# Patient Record
Sex: Female | Born: 2000 | Race: Black or African American | Hispanic: No | Marital: Single | Smoking: Never smoker
Health system: Southern US, Community
[De-identification: ages and names within clinical notes are randomized; demographics above are authoritative.]

## PROBLEM LIST (undated history)

## (undated) ENCOUNTER — Ambulatory Visit

## (undated) DIAGNOSIS — A749 Chlamydial infection, unspecified: Secondary | ICD-10-CM

## (undated) DIAGNOSIS — D649 Anemia, unspecified: Secondary | ICD-10-CM

## (undated) HISTORY — PX: FRACTURE SURGERY: SHX138

---
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
## 2020-08-26 ENCOUNTER — Other Ambulatory Visit: Payer: Self-pay

## 2020-08-26 ENCOUNTER — Encounter: Payer: Self-pay | Admitting: Nurse Practitioner

## 2020-08-26 ENCOUNTER — Ambulatory Visit (INDEPENDENT_AMBULATORY_CARE_PROVIDER_SITE_OTHER): Payer: 59 | Admitting: Nurse Practitioner

## 2020-08-26 ENCOUNTER — Other Ambulatory Visit (INDEPENDENT_AMBULATORY_CARE_PROVIDER_SITE_OTHER): Payer: 59

## 2020-08-26 VITALS — BP 108/68 | HR 98 | Wt 133.0 lb

## 2020-08-26 DIAGNOSIS — D509 Iron deficiency anemia, unspecified: Secondary | ICD-10-CM

## 2020-08-26 DIAGNOSIS — K625 Hemorrhage of anus and rectum: Secondary | ICD-10-CM | POA: Diagnosis not present

## 2020-08-26 LAB — CBC WITH DIFFERENTIAL/PLATELET
Basophils Absolute: 0.1 10*3/uL (ref 0.0–0.1)
Basophils Relative: 0.9 % (ref 0.0–3.0)
Eosinophils Absolute: 0.1 10*3/uL (ref 0.0–0.7)
Eosinophils Relative: 1.1 % (ref 0.0–5.0)
HCT: 40.8 % (ref 36.0–49.0)
Hemoglobin: 13.7 g/dL (ref 12.0–16.0)
Lymphocytes Relative: 38.4 % (ref 24.0–48.0)
Lymphs Abs: 2.6 10*3/uL (ref 0.7–4.0)
MCHC: 33.6 g/dL (ref 31.0–37.0)
MCV: 82.3 fl (ref 78.0–98.0)
Monocytes Absolute: 0.4 10*3/uL (ref 0.1–1.0)
Monocytes Relative: 5.7 % (ref 3.0–12.0)
Neutro Abs: 3.7 10*3/uL (ref 1.4–7.7)
Neutrophils Relative %: 53.9 % (ref 43.0–71.0)
Platelets: 257 10*3/uL (ref 150.0–575.0)
RBC: 4.96 Mil/uL (ref 3.80–5.70)
RDW: 13.8 % (ref 11.4–15.5)
WBC: 6.8 10*3/uL (ref 4.5–13.5)

## 2020-08-26 LAB — FERRITIN: Ferritin: 14.9 ng/mL (ref 10.0–291.0)

## 2020-08-26 MED ORDER — HYDROCORTISONE (PERIANAL) 2.5 % EX CREA: 1.0000 "application " | TOPICAL_CREAM | Freq: Every day | CUTANEOUS | 1 refills | Status: DC

## 2020-08-26 NOTE — Progress Notes (Signed)
     ASSESSMENT AND PLAN    # 20 yo female several month history of intermittent rectal bleeding with bowel movements. Small internal hemorrhoids on anoscopy. --CBC   --Continue with good hydration.  --Start daily Metamucil --Anusol cream PR Q HS x 10 days --Follow up with me in 3-4 weeks. If persistent bleeding then consider colonoscopy   # Hx of microcytic anemia in December 2020 (around time of C-section).  --update labs with CBC, ferritin, tibc  HISTORY OF PRESENT ILLNESS     Chief Complaint : hemorrhoids  Shelia Rivera is a 20 y.o. female with no significant past medical history. She is referred by PCP for rectal bleeding and hemorrhoids.   Several months ago patient developed hemorrhoids with perianal discomfort and intermittent bleeding with bowel movements.  She has used prep H but only externally but it has sometimes helped. Her stools are sometimes hard even though she takes 2 stool softeners at night. She sometimes feels like straining is necessary to expel stool past the hemorrhoids.  She generally has about 4 BMs a week. She drinks a gallon of water a day and eats a high fiber diet. She has no abdominal pain, nausea / vomiting or other GI complaints.   No FMH of GI diseases in family except maybe a paternal uncle with colon cancer at unknown.age.    Data Reviewed: December 2020 Hgb mid 8 range ( following a C-section) .   PREVIOUS EVALUATIONS:   none  Past Medical History:  Diagnosis Date  . Anemia   . Chlamydia      Past Surgical History:  Procedure Laterality Date  . CESAREAN SECTION N/A 05/16/2019   Procedure: CESAREAN SECTION;  Surgeon: Kathrynn Running, MD;  Location: [facility] LD ORS;  Service: Obstetrics;  Laterality: N/A;   No family history on file. Social History   Tobacco Use  . Smoking status: Never Smoker  . Smokeless tobacco: Never Used  Vaping Use  . Vaping Use: Never used  Substance Use Topics  . Alcohol use: Never  . Drug use: Never    No current outpatient medications on file.   No current facility-administered medications for this visit.   No Known Allergies   Review of Systems: All  systems reviewed and negative except where noted in HPI.   PHYSICAL EXAM :    Wt Readings from Last 3 Encounters:  08/26/20 133 lb (60.3 kg) (59 %, Z= 0.22)*  05/31/19 126 lb 8 oz (57.4 kg) (52 %, Z= 0.06)*  05/16/19 142 lb (64.4 kg) (76 %, Z= 0.70)*   * Growth percentiles are based on CDC (Girls, 2-20 Years) data.    BP 108/68   Pulse 98   Wt 133 lb (60.3 kg)   BMI 26.86 kg/m  Constitutional:  Pleasant female in no acute distress. Psychiatric: Normal mood and affect. Behavior is normal. EENT: Pupils normal.  Conjunctivae are normal. No scleral icterus. Neck supple.  Cardiovascular: Normal rate, regular rhythm. No edema Pulmonary/chest: Effort normal and breath sounds normal. No wheezing, rales or rhonchi. Abdominal: Soft, nondistended, nontender. Bowel sounds active throughout. There are no masses palpable. No hepatomegaly. Rectal: no fissures or external hemorrhoids. On anoscopy she has mildly inflamed internal hemorrhoids.  Neurological: Alert and oriented to person place and time. Skin: Skin is warm and dry. No rashes noted.  Willette Cluster, NP  08/26/2020, 3:22 PM  Cc:  Referring Provider Dinsbeer, Leotis Shames, FNP

## 2020-08-26 NOTE — Progress Notes (Signed)
Agree with assessment and plan as outlined. If she still has persistent bleeding despite conservative measures would pursue colonoscopy.

## 2020-08-26 NOTE — Patient Instructions (Addendum)
Continue to take 2  stool softeners at night   Add Metamucil every morning   We will send Anusol cream to your pharmacy  Your provider has requested that you go to the basement level for lab work before leaving today. Press "B" on the elevator. The lab is located at the first door on the left as you exit the elevator.  Due to recent changes in healthcare laws, you may see the results of your imaging and laboratory studies on MyChart before your provider has had a chance to review them.  We understand that in some cases there may be results that are confusing or concerning to you. Not all laboratory results come back in the same time frame and the provider may be waiting for multiple results in order to interpret others.  Please give [place] 48 hours in order for your provider to thoroughly review all the results before contacting the office for clarification of your results.   I appreciate the  opportunity to care for you  Thank You   Midge Minium

## 2020-08-27 ENCOUNTER — Other Ambulatory Visit: Payer: Self-pay

## 2020-08-27 ENCOUNTER — Telehealth: Payer: Self-pay

## 2020-08-27 DIAGNOSIS — K625 Hemorrhage of anus and rectum: Secondary | ICD-10-CM

## 2020-08-27 LAB — IRON AND TIBC
Iron Saturation: 13 % — ABNORMAL LOW (ref 15–55)
Iron: 42 ug/dL (ref 27–159)
Total Iron Binding Capacity: 324 ug/dL (ref 250–450)
UIBC: 282 ug/dL (ref 131–425)

## 2020-08-27 MED ORDER — FERROUS SULFATE 325 (65 FE) MG PO TABS: 325.0000 mg | ORAL_TABLET | Freq: Every day | ORAL | 3 refills | Status: DC

## 2020-08-27 NOTE — Telephone Encounter (Signed)
Left message for patient to please call back. 

## 2020-08-27 NOTE — Telephone Encounter (Signed)
-----   Message from Meredith Pel, NP sent at 08/27/2020  1:23 PM EDT ----- She appears to be iron deficient without anemia.  I will be seeing her back in clinic for rectal bleeding and can discuss further.  Iron deficiency in a menstruating female is not unusual but given the rectal bleeding a colonoscopy is not unreasonable.  Without any upper GI symptoms I do not think she needs an upper endoscopy.  Celiac disease unusual in a a population but can check a TTG and IgA when she comes for follow-up. Recommend ferrous sulfate 325 mg daily and repeat ferritin and TIBC in 8 weeks.

## 2020-08-29 IMAGING — CT CT ABD-PELV W/ CM
2 of 4 series · 16 of 46 positions shown, 18 images · IV contrast (APPLIED)
Comparison: Pelvic ultrasound 05/20/2019

CLINICAL DATA: Postoperative fever.  Five days post C-section.

EXAM:
CT ABDOMEN AND PELVIS WITH CONTRAST
TECHNIQUE: Multidetector CT imaging of the abdomen and pelvis was performed
using the standard protocol following bolus administration of
intravenous contrast.
CONTRAST:  100mL OMNIPAQUE IOHEXOL 300 MG/ML  SOLN

[Series 3: abdomen 5.0 · axial · 0.73mm/px · z∈[+650,+1040]mm · 13 of 88 slices shown, 15 images]
[im 5/88  soft-tissue]
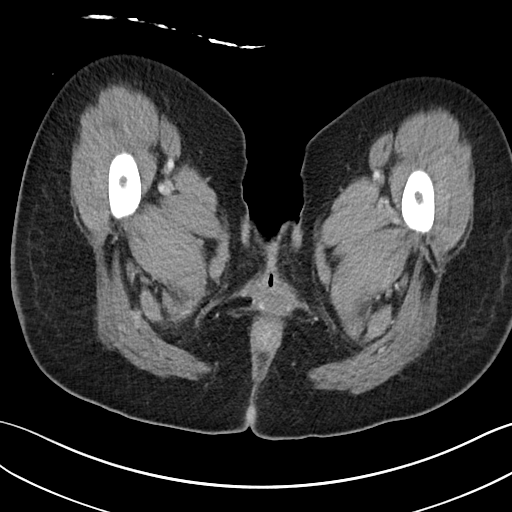
[im 5/88  bone]
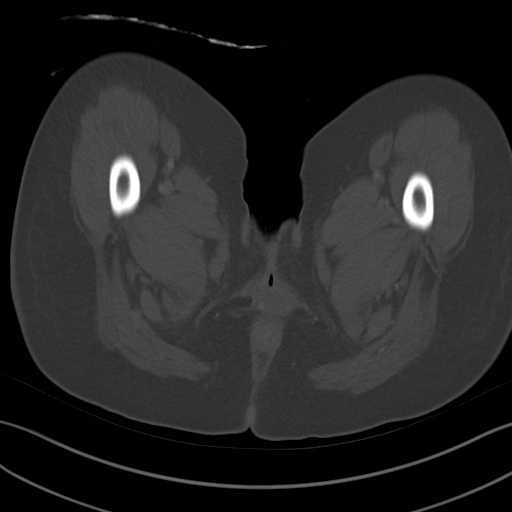
[im 14/88  soft-tissue]
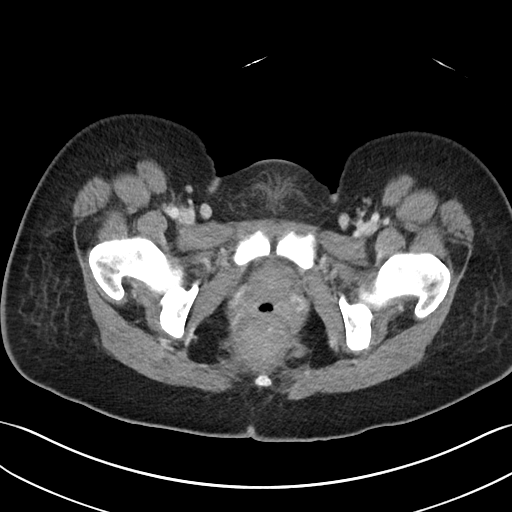
[im 19/88  soft-tissue]
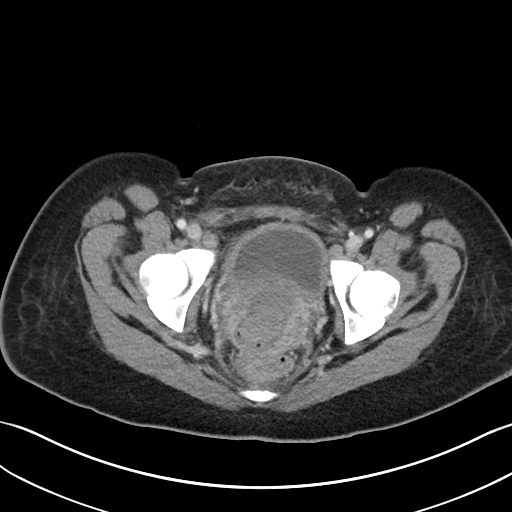
[im 23/88  soft-tissue]
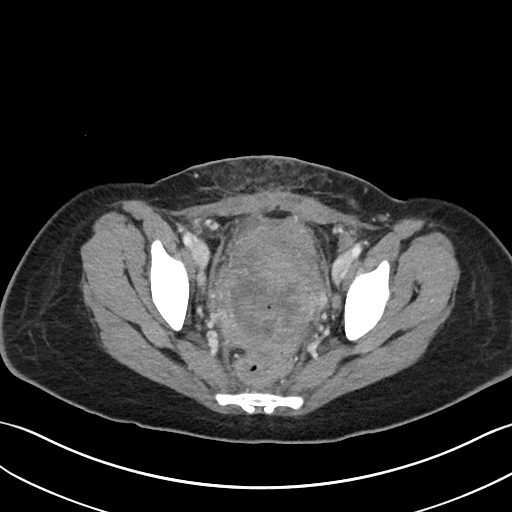
[im 33/88  soft-tissue]
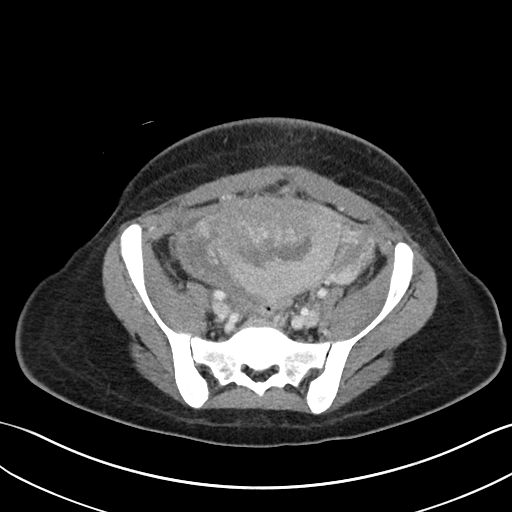
[im 37/88  soft-tissue]
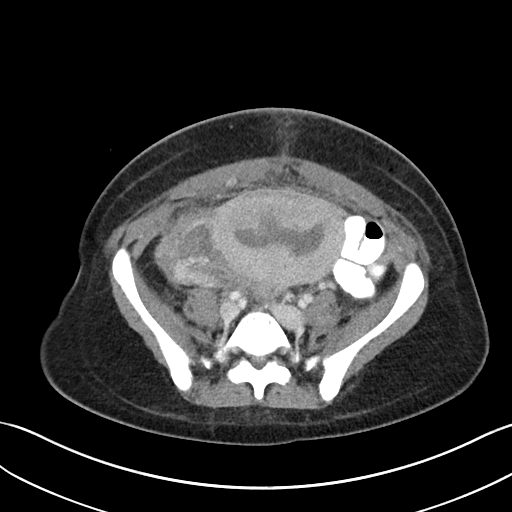
[im 46/88  soft-tissue]
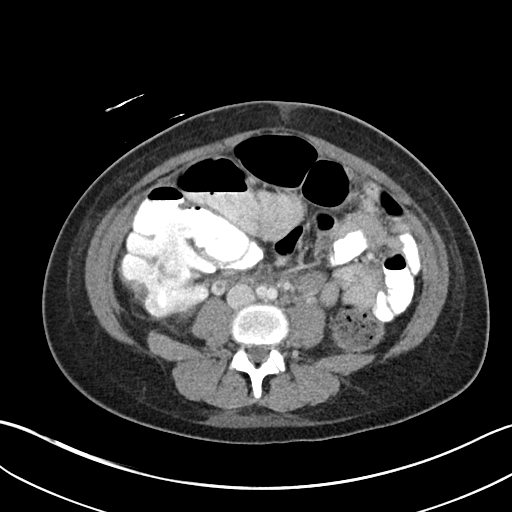
[im 51/88  soft-tissue]
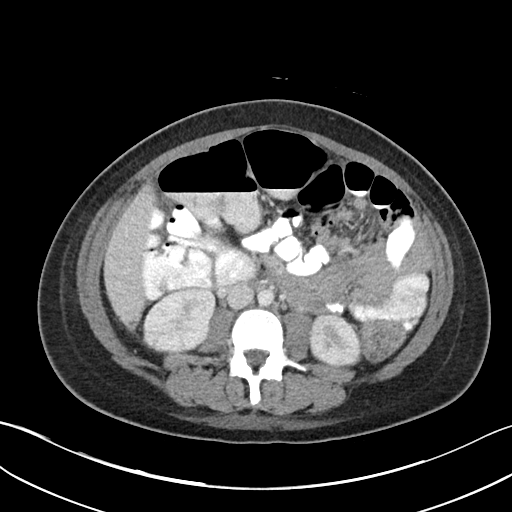
[im 55/88  soft-tissue]
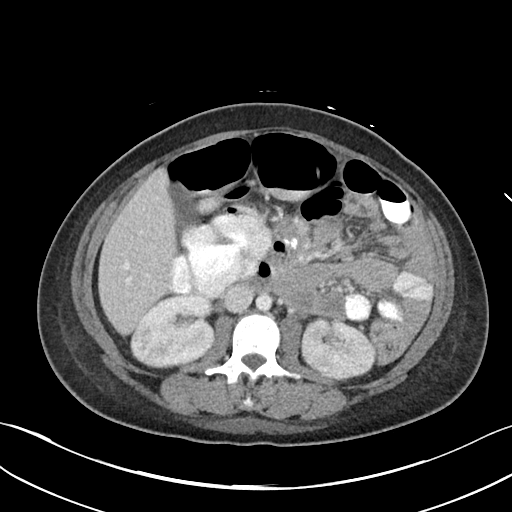
[im 55/88  bone]
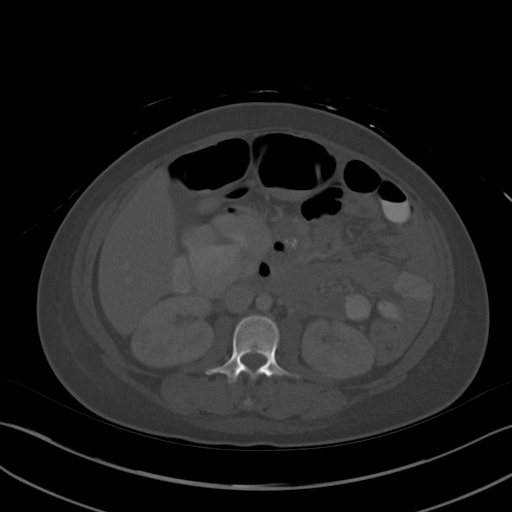
[im 65/88  soft-tissue]
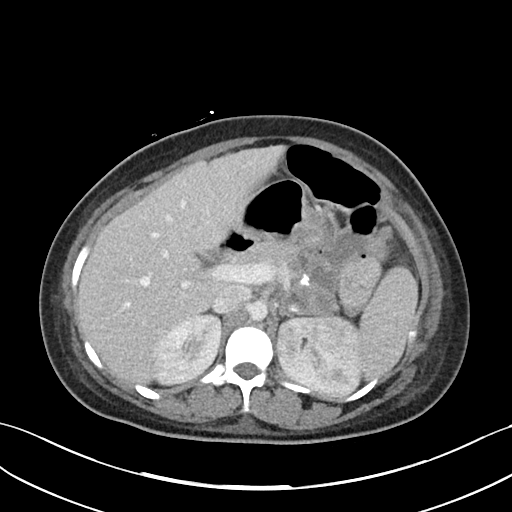
[im 69/88  soft-tissue]
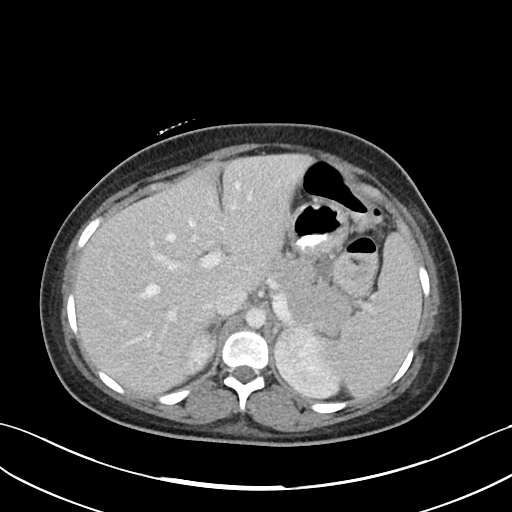
[im 74/88  soft-tissue]
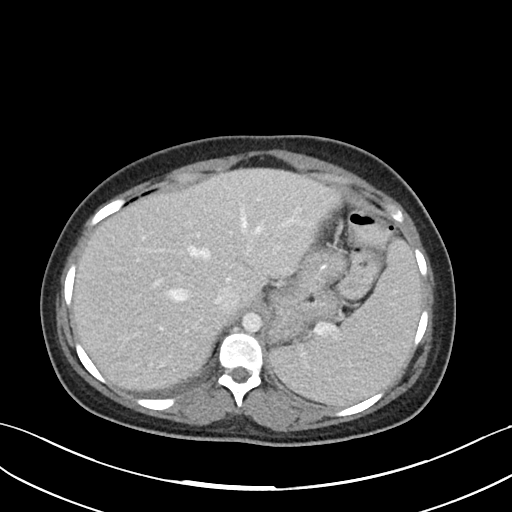
[im 83/88  soft-tissue]
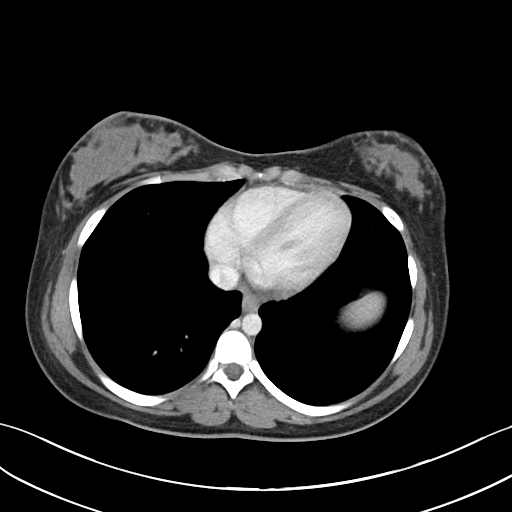

[Series 6: abdomen 3.0 mpr cor · coronal · 0.71mm/px · 3 of 81 slices shown]
[im 27/81  soft-tissue]
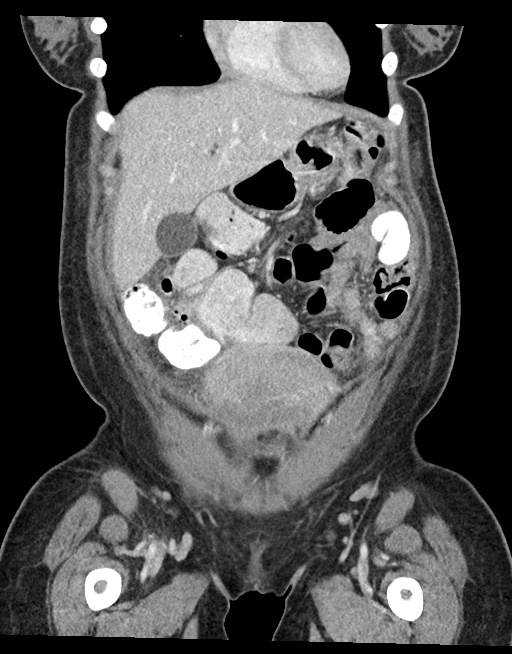
[im 36/81  soft-tissue]
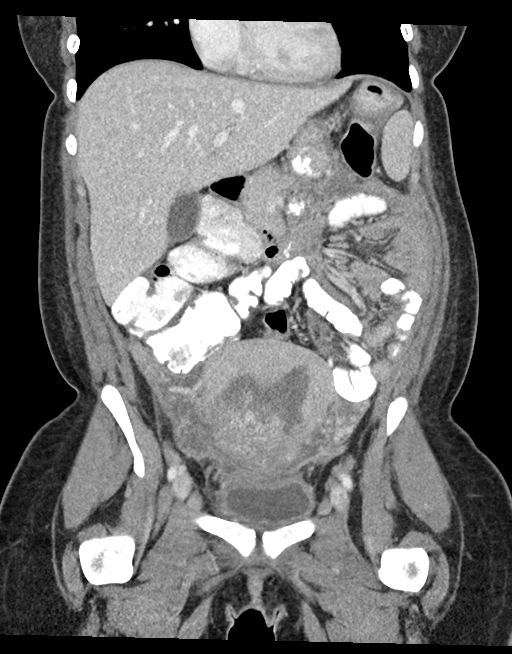
[im 45/81  soft-tissue]
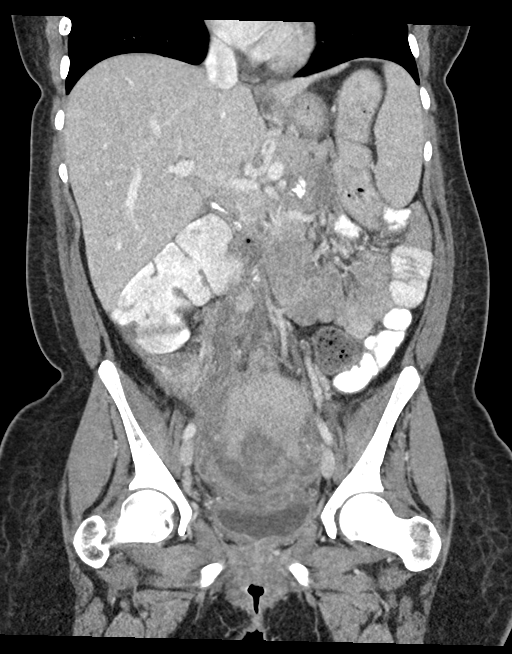

[16 of 46 positions shown; findings below may reference images not displayed]

FINDINGS: Lower chest: The lung bases are clear of acute process. No pleural
effusion or pulmonary lesions. The heart is normal in size. No
pericardial effusion. The distal esophagus and aorta are
unremarkable.

Hepatobiliary: No focal hepatic lesions or intrahepatic biliary
dilatation. The gallbladder is normal. No common bile duct
dilatation.

Pancreas: No mass, inflammation or ductal dilatation.

Spleen: Borderline splenomegaly measuring 12.5 x 11.5 x 10.5 cm. No
focal lesions.

Adrenals/Urinary Tract: Adrenal glands are normal.

Kidneys demonstrate changes of pyelonephritis but no renal abscess
or hydronephrosis. Bladder is grossly normal.

Stomach/Bowel: The stomach, duodenum, small bowel and colon are
grossly normal. No inflammatory changes, mass lesions or obstructive
findings. The terminal ileum and appendix are normal.

Vascular/Lymphatic: The aorta is normal in caliber. The branch
vessels are patent. No mesenteric or retroperitoneal mass or
adenopathy.

Reproductive: Expected appearing postpartum changes. No obvious
retraining products. Prominent vascularity in the anterior
myometrium likely due to the placental attachment. Expected debris
in the endocervical canal. I do not see any findings suspicious for
endometrial abscess.

There is a filling defect in the right gonadal vein but no complete
occlusion/thrombosis.

Other: No free pelvic fluid collections, intrapelvic abscess or
intra-abdominal abscess.

Musculoskeletal: No significant bony findings.
IMPRESSION: 1. CT findings consistent with bilateral pyelonephritis. No abscess
or hydronephrosis.
2. Small amount of thrombus is noted in the right gonadal vein.
3. Expected postpartum changes involving the uterus. No obvious
findings for endometrial abscess or pelvic abscess or retained
products of conception.

## 2020-09-05 IMAGING — US US RENAL
1 series · 15 of 25 positions shown · non-contrast
Comparison: None.

CLINICAL DATA: Initial evaluation for acute lower back an
right-sided abdominal pain. Twelve days postpartum.

EXAM:
RENAL / URINARY TRACT ULTRASOUND COMPLETE

[Series 1: us renal · 15 of 39 slices shown]
[im 1/39]
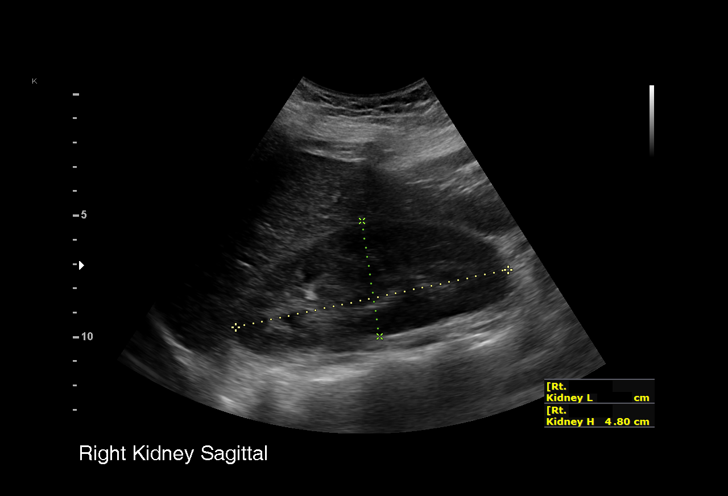
[im 4/39]
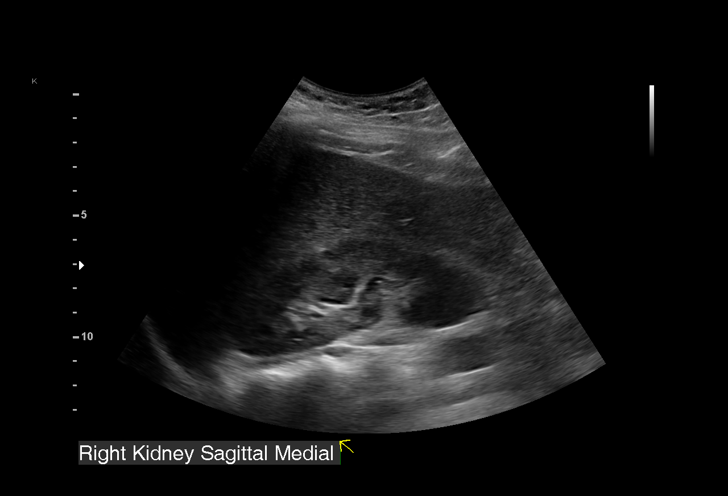
[im 7/39]
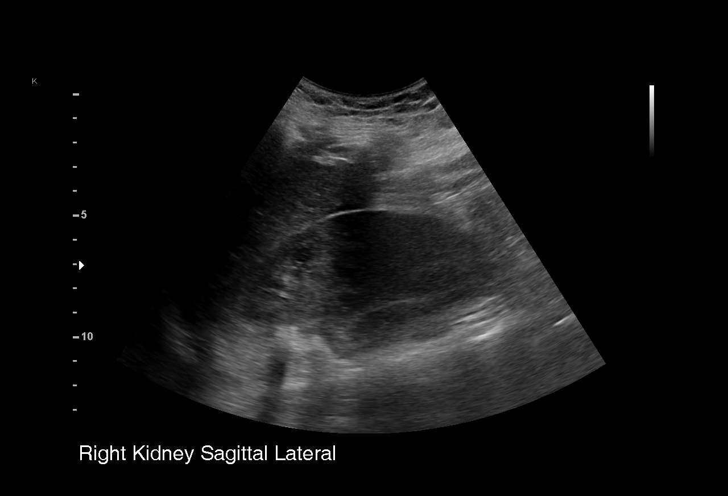
[im 8/39]
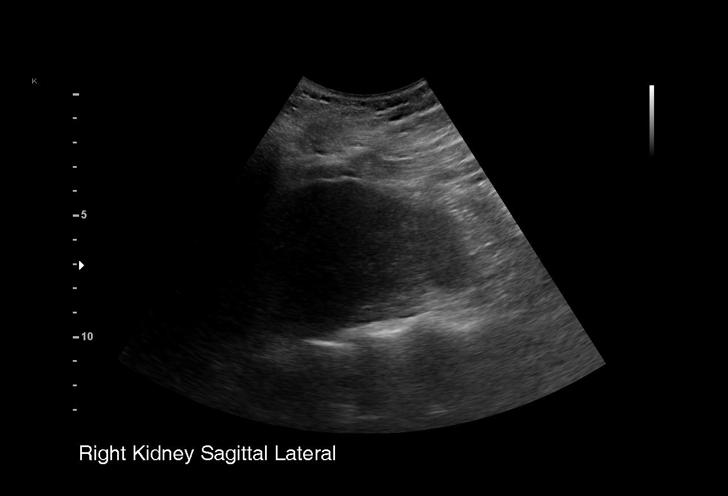
[im 12/39]
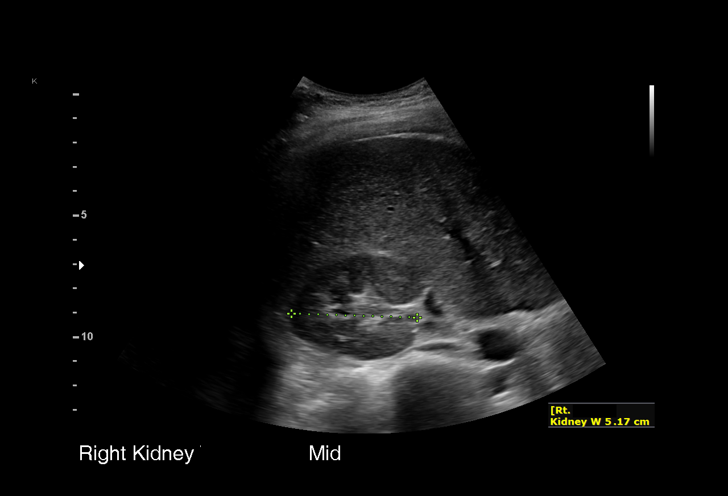
[im 15/39]
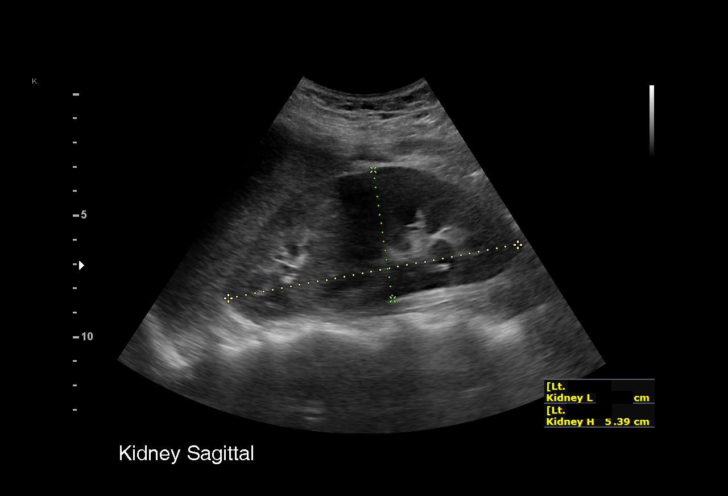
[im 16/39]
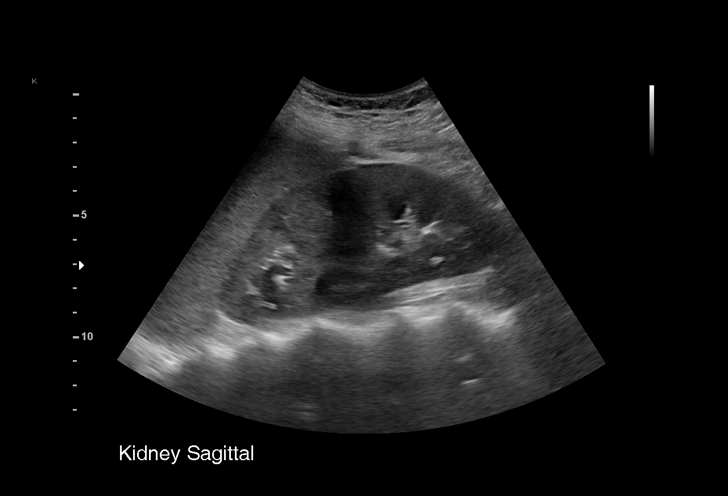
[im 20/39]
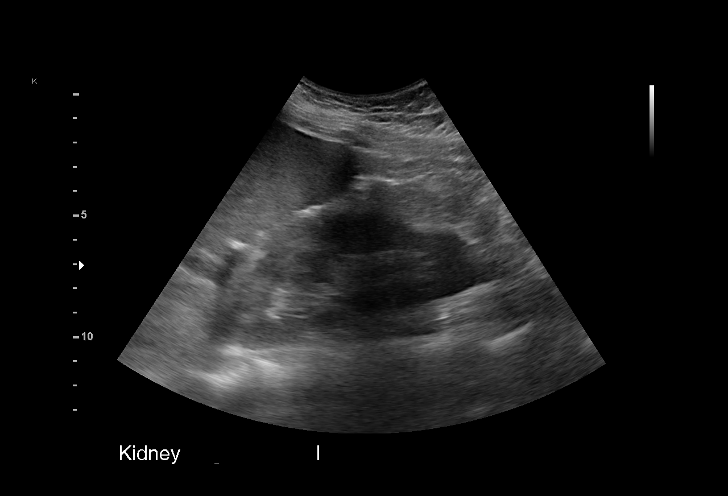
[im 23/39]
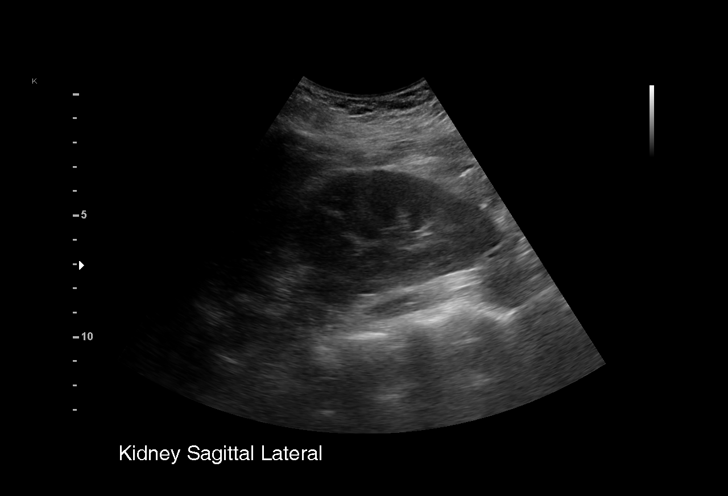
[im 24/39]
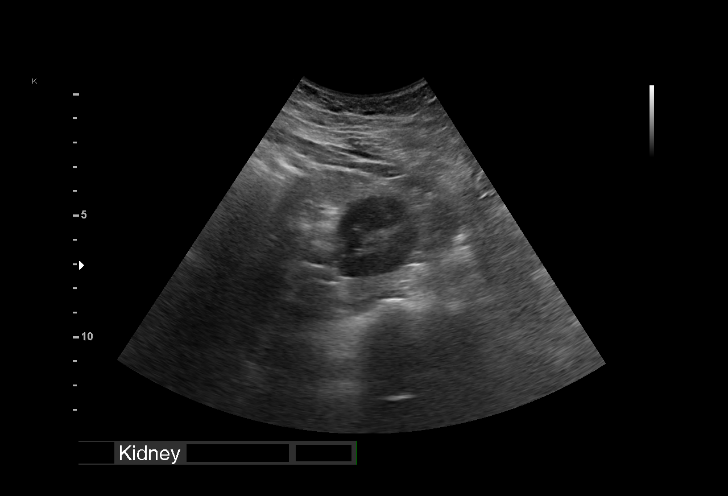
[im 27/39]
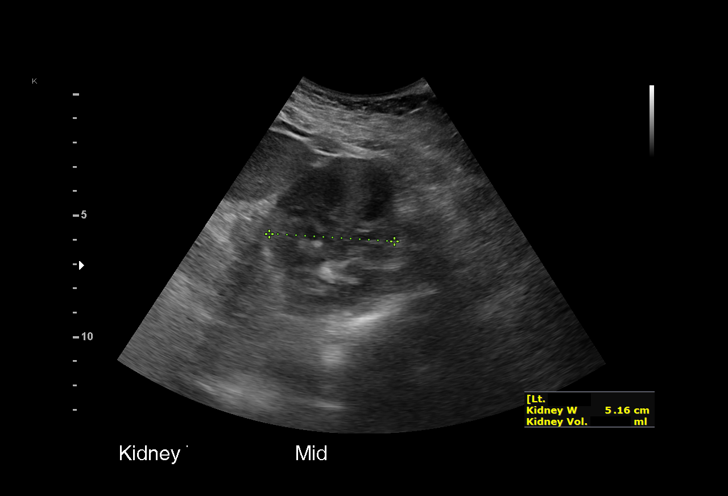
[im 31/39]
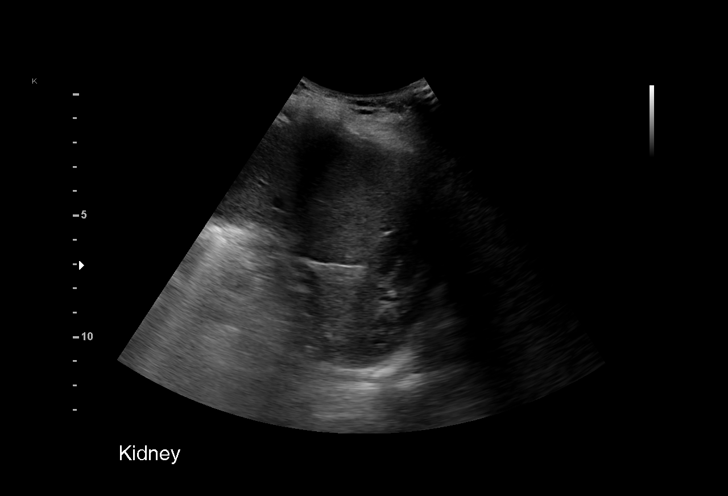
[im 32/39]
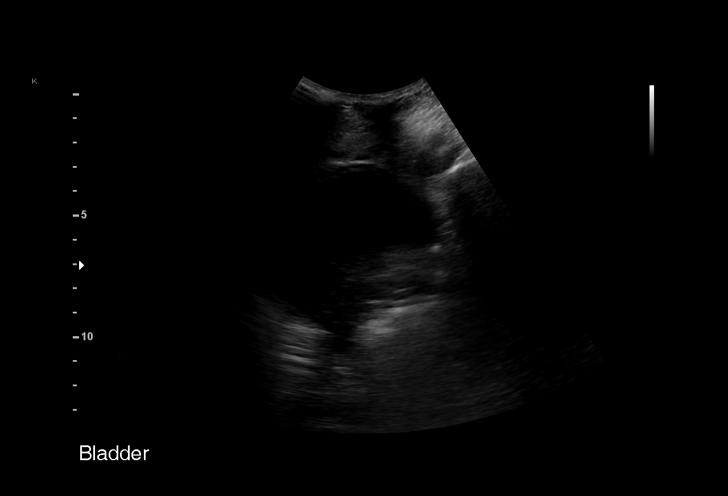
[im 35/39]
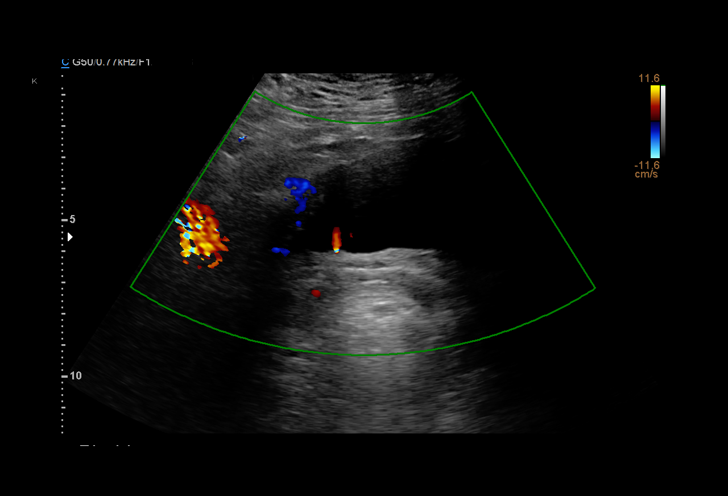
[im 39/39]
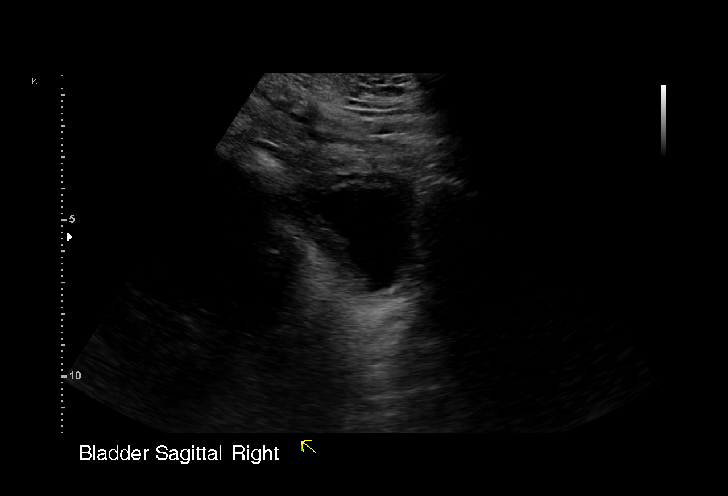

[15 of 25 positions shown; findings below may reference images not displayed]

FINDINGS: Right Kidney:

Renal measurements: 11.5 x 4.8 x 5.2 cm = volume: 149.5 mL.
Echogenicity within normal limits. No mass or hydronephrosis
visualized. No visible nephrolithiasis.

Left Kidney:

Renal measurements: 12.1 x 5.4 x 5.2 cm = volume: 176.3 mL.
Echogenicity within normal limits. No mass or hydronephrosis
visualized. No visible nephrolithiasis.

Bladder:

Appears normal for degree of bladder distention. Bilateral ureteral
jets are visualized.

Other:

None.
IMPRESSION: Normal renal ultrasound. No sonographic evidence for nephrolithiasis
or obstructive uropathy.

## 2020-10-02 ENCOUNTER — Ambulatory Visit: Payer: 59 | Admitting: Nurse Practitioner

## 2020-10-13 ENCOUNTER — Other Ambulatory Visit: Payer: Self-pay

## 2020-10-13 ENCOUNTER — Encounter: Payer: Self-pay | Admitting: Nurse Practitioner

## 2020-10-13 ENCOUNTER — Ambulatory Visit (INDEPENDENT_AMBULATORY_CARE_PROVIDER_SITE_OTHER): Payer: 59 | Admitting: Nurse Practitioner

## 2020-10-13 VITALS — BP 112/68 | HR 80 | Ht 59.0 in | Wt 136.0 lb

## 2020-10-13 DIAGNOSIS — K625 Hemorrhage of anus and rectum: Secondary | ICD-10-CM | POA: Diagnosis not present

## 2020-10-13 MED ORDER — NA SULFATE-K SULFATE-MG SULF 17.5-3.13-1.6 GM/177ML PO SOLN: 1.0000 | Freq: Once | ORAL | 0 refills | Status: AC

## 2020-10-13 NOTE — Progress Notes (Signed)
Agree with assessment and plan as outlined.  

## 2020-10-13 NOTE — Progress Notes (Signed)
Reviewed and agree with management plans. ? ?Shelia Nathanson L. Lara Palinkas, MD, MPH  ?

## 2020-10-13 NOTE — Patient Instructions (Addendum)
If you are age 20 or older, your body mass index should be between 23-30. Your Body mass index is 27.47 kg/m. If this is out of the aforementioned range listed, please consider follow up with your Primary Care Provider.  If you are age 58 or younger, your body mass index should be between 19-25. Your Body mass index is 27.47 kg/m. If this is out of the aformentioned range listed, please consider follow up with your Primary Care Provider.   Miralax 1 capful daily.  You have been scheduled for a colonoscopy. Please follow written instructions given to you at your visit today.  Please pick up your prep supplies at the pharmacy within the next 1-3 days. If you use inhalers (even only as needed), please bring them with you on the day of your procedure.  Due to recent changes in healthcare laws, you may see the results of your imaging and laboratory studies on MyChart before your provider has had a chance to review them.  We understand that in some cases there may be results that are confusing or concerning to you. Not all laboratory results come back in the same time frame and the provider may be waiting for multiple results in order to interpret others.  Please give [place] 48 hours in order for your provider to thoroughly review all the results before contacting the office for clarification of your results.

## 2020-10-13 NOTE — Progress Notes (Signed)
     ASSESSMENT AND PLAN    #20 year old female with intermittent, painless rectal bleeding with bowel movements.  Bleeding resolved after course of Anusol and addition of Metamucil but then she had 2 more episodes of bleeding a few weeks. The bleeding is most likely secondary to internal hemorrhoids found on anoscopy last visit but will proceed with colonoscopy to rule out other etiologies.  --The risks and benefits of colonoscopy with possible polypectomy / biopsies were discussed and the patient agrees to proceed.  Procedure will be done by Dr. Orvan Falconer as Dr. Adela Lank has no availability until late July -- Continue Metamucil, take on a daily basis if tolerated    HISTORY OF PRESENT ILLNESS    Chief Complaint : Follow-up on rectal bleeding  Shelia Rivera is a 20 y.o. female known to Dr. Adela Lank. She has no significant medical history.   Patient was seen late March 2022 for evaluation of a several month history of intermittent painless rectal bleeding with bowel movements.  She had small internal hemorrhoids on anoscopy.  Patient was started on daily Metamucil and treated with 10 days of Anusol cream.  Labs were checked and though her hemoglobin was 13.7, her ferritin was only 14.9.  TIBC was normal at 324 but her percent saturation was low at 13    INTERVAL HISTORY: Shelia Rivera used the Ansuol for 10 days. She has been taking Metamucil about three times a week and averaging about 5 soft BMs a week. The rectal bleeding stopped after a course of Anusol but then she had two additional episodes of minor bleeding a few weeks ago.       Past Medical History:  Diagnosis Date  . Anemia   . Chlamydia     Current Medications, Allergies, Past Surgical History, Family History and Social History were reviewed in [employer] record.   Current Outpatient Medications  Medication Sig Dispense Refill  . Docusate Calcium (STOOL SOFTENER PO) Take by mouth.    .  hydrocortisone (ANUSOL-HC) 2.5 % rectal cream Place 1 application rectally at bedtime. For 10 days 30 g 1  . psyllium (METAMUCIL) 58.6 % powder Take 1 packet by mouth daily.     No current facility-administered medications for this visit.    Review of Systems: No chest pain. No shortness of breath. No urinary complaints.   PHYSICAL EXAM :    Wt Readings from Last 3 Encounters:  10/13/20 136 lb (61.7 kg) (63 %, Z= 0.34)*  08/26/20 133 lb (60.3 kg) (59 %, Z= 0.22)*  05/31/19 126 lb 8 oz (57.4 kg) (52 %, Z= 0.06)*   * Growth percentiles are based on CDC (Girls, 2-20 Years) data.    BP 112/68   Pulse 80   Ht 4\' 11"  (1.499 m)   Wt 136 lb (61.7 kg)   BMI 27.47 kg/m  Constitutional:  Pleasant female in no acute distress. Psychiatric: Normal mood and affect. Behavior is normal. EENT: Pupils normal.  Conjunctivae are normal. No scleral icterus. Neck supple.  Cardiovascular: Normal rate, regular rhythm. No edema Pulmonary/chest: Effort normal and breath sounds normal. No wheezing, rales or rhonchi. Abdominal: Soft, nondistended, nontender. Bowel sounds active throughout. There are no masses palpable. No hepatomegaly. Neurological: Alert and oriented to person place and time. Skin: Skin is warm and dry. No rashes noted.  , NP  10/13/2020, 3:24 PM

## 2020-10-24 ENCOUNTER — Encounter: Payer: Self-pay | Admitting: Gastroenterology

## 2020-10-30 ENCOUNTER — Other Ambulatory Visit: Payer: Self-pay

## 2021-02-26 ENCOUNTER — Ambulatory Visit (HOSPITAL_COMMUNITY)
Admission: EM | Admit: 2021-02-26 | Discharge: 2021-02-26 | Disposition: A | Discharge status: Home / Self Care | Payer: 59 | Attending: Physician Assistant | Admitting: Physician Assistant

## 2021-02-26 ENCOUNTER — Other Ambulatory Visit: Payer: Self-pay

## 2021-02-26 ENCOUNTER — Ambulatory Visit (INDEPENDENT_AMBULATORY_CARE_PROVIDER_SITE_OTHER): Payer: 59

## 2021-02-26 ENCOUNTER — Encounter (HOSPITAL_COMMUNITY): Payer: Self-pay | Admitting: Emergency Medicine

## 2021-02-26 DIAGNOSIS — J40 Bronchitis, not specified as acute or chronic: Secondary | ICD-10-CM

## 2021-02-26 DIAGNOSIS — R058 Other specified cough: Secondary | ICD-10-CM

## 2021-02-26 DIAGNOSIS — J329 Chronic sinusitis, unspecified: Secondary | ICD-10-CM

## 2021-02-26 DIAGNOSIS — R059 Cough, unspecified: Secondary | ICD-10-CM

## 2021-02-26 DIAGNOSIS — R0602 Shortness of breath: Secondary | ICD-10-CM | POA: Diagnosis not present

## 2021-02-26 MED ORDER — AMOXICILLIN-POT CLAVULANATE 875-125 MG PO TABS: 1.0000 | ORAL_TABLET | Freq: Two times a day (BID) | ORAL | 0 refills | Status: DC

## 2021-02-26 MED ORDER — METHYLPREDNISOLONE SODIUM SUCC 125 MG IJ SOLR
INTRAMUSCULAR | Status: AC
  Filled 2021-02-26: qty 2

## 2021-02-26 MED ORDER — METHYLPREDNISOLONE SODIUM SUCC 125 MG IJ SOLR
60.0000 mg | Freq: Once | INTRAMUSCULAR | Status: AC
  Administered 2021-02-26: 60 mg via INTRAMUSCULAR

## 2021-02-26 NOTE — ED Provider Notes (Addendum)
[facility]    CSN: [id number] Arrival date & time: 02/26/21  8338      History   Chief Complaint Chief Complaint  Patient presents with   Cough   Chest Pain   Shoulder Pain    HPI Shelia Rivera is a 20 y.o. female.   Patient presents today with a weeklong history of cough and congestion symptoms.  She reports over the past 24 hours these have worsened and her mucus has become thicker and she is now experiencing shortness of breath.  She also reports intermittent central chest stabbing pains.  She is currently asymptomatic and has not identified any trigger.  She denies any pleuritic pain.  She does not take birth control and denies any history of malignancy, or personal or family history of VTE event.  She denies any recent antibiotic use.  She has tried Mucinex without improvement of symptoms.  She took a COVID test that was negative.  She is up-to-date on COVID-19 vaccinations.  Denies history of asthma, allergies, COPD, smoking.   Past Medical History:  Diagnosis Date   Anemia    Chlamydia     Patient Active Problem List   Diagnosis Date Noted   Labor and delivery, indication for care 05/16/2019   S/P primary low transverse C-section 05/16/2019   Sepsis (HCC) 05/16/2019    Past Surgical History:  Procedure Laterality Date   CESAREAN SECTION N/A 05/16/2019   Procedure: CESAREAN SECTION;  Surgeon: Kathrynn Running, MD;  Location: [facility] LD ORS;  Service: Obstetrics;  Laterality: N/A;   FRACTURE SURGERY      OB History     Gravida  1   Para  1   Term  1   Preterm      AB      Living  1      SAB      IAB      Ectopic      Multiple  0   Live Births  1            Home Medications    Prior to Admission medications   Medication Sig Start Date End Date Taking? Authorizing Provider  amoxicillin-clavulanate (AUGMENTIN) 875-125 MG tablet Take 1 tablet by mouth every 12 (twelve) hours. 02/26/21  Yes Alka Falwell, Noberto Retort, PA-C    Family  History History reviewed. No pertinent family history.  Social History Social History   Tobacco Use   Smoking status: Never   Smokeless tobacco: Never  Vaping Use   Vaping Use: Never used  Substance Use Topics   Alcohol use: Never   Drug use: Never     Allergies   Patient has no known allergies.   Review of Systems Review of Systems  Constitutional:  Positive for activity change and fatigue. Negative for appetite change and fever.  HENT:  Positive for congestion and sinus pressure. Negative for sneezing and sore throat.   Respiratory:  Positive for cough, chest tightness and shortness of breath. Negative for wheezing.   Cardiovascular:  Negative for chest pain.  Gastrointestinal:  Negative for abdominal pain, diarrhea, nausea and vomiting.  Musculoskeletal:  Negative for arthralgias and myalgias.  Neurological:  Positive for headaches. Negative for dizziness and light-headedness.    Physical Exam Triage Vital Signs ED Triage Vitals  Enc Vitals Group     BP 02/26/21 0958 124/77     Pulse Rate 02/26/21 0958 (!) 114     Resp 02/26/21 0958 17  Temp 02/26/21 0958 99 F (37.2 C)     Temp Source 02/26/21 0958 Oral     SpO2 02/26/21 0958 100 %     Weight --      Height --      Head Circumference --      Peak Flow --      Pain Score 02/26/21 0955 0     Pain Loc --      Pain Edu? --      Excl. in GC? --    No data found.  Updated Vital Signs BP 124/77 (BP Location: Right Arm)   Pulse (!) 103   Temp 99 F (37.2 C) (Oral)   Resp 17   SpO2 100%   Visual Acuity Right Eye Distance:   Left Eye Distance:   Bilateral Distance:    Right Eye Near:   Left Eye Near:    Bilateral Near:     Physical Exam Vitals reviewed.  Constitutional:      General: She is awake. She is not in acute distress.    Appearance: Normal appearance. She is well-developed. She is not ill-appearing.     Comments: Very pleasant female appears stated age no acute distress sitting  comfortably in exam room  HENT:     Head: Normocephalic and atraumatic.     Right Ear: Tympanic membrane, ear canal and external ear normal. Tympanic membrane is not erythematous or bulging.     Left Ear: Tympanic membrane, ear canal and external ear normal. Tympanic membrane is not erythematous or bulging.     Nose:     Right Sinus: No maxillary sinus tenderness or frontal sinus tenderness.     Left Sinus: No maxillary sinus tenderness or frontal sinus tenderness.     Mouth/Throat:     Pharynx: Uvula midline. Posterior oropharyngeal erythema present. No oropharyngeal exudate.     Comments: Erythema and drainage present posterior pharynx Cardiovascular:     Rate and Rhythm: Normal rate and regular rhythm.     Heart sounds: Normal heart sounds, S1 normal and S2 normal. No murmur heard. Pulmonary:     Effort: Pulmonary effort is normal.     Breath sounds: Normal breath sounds. No wheezing, rhonchi or rales.  Psychiatric:        Behavior: Behavior is cooperative.     UC Treatments / Results  Labs (all labs ordered are listed, but only abnormal results are displayed) Labs Reviewed - No data to display  EKG   Radiology DG Chest 2 View  Result Date: 02/26/2021 CLINICAL DATA:  Shortness of breath, productive cough EXAM: CHEST - 2 VIEW COMPARISON:  05/20/2019 FINDINGS: The heart size and mediastinal contours are within normal limits. Both lungs are clear. The visualized skeletal structures are unremarkable. IMPRESSION: No acute abnormality of the lungs. Electronically Signed   By: Jearld Lesch M.D.   On: 02/26/2021 13:19    Procedures Procedures (including critical care time)  Medications Ordered in UC Medications  methylPREDNISolone sodium succinate (SOLU-MEDROL) 125 mg/2 mL injection 60 mg (60 mg Intramuscular Given 02/26/21 1307)    Initial Impression / Assessment and Plan / UC Course  I have reviewed the triage vital signs and the nursing notes.  Pertinent labs & imaging  results that were available during my care of the patient were reviewed by me and considered in my medical decision making (see chart for details).     EKG obtained showed normal sinus rhythm with ventricular rate of 96 bpm without  ischemic changes; no previous to compare.  Chest x-ray obtained showed no acute abnormalities.  No indication for viral testing given patient has been symptomatic for a week.  She was given 60 mg of Solu-Medrol in clinic today without significant improvement.  We will start antibiotics given prolonged and worsening symptoms.  Encouraged her to use over-the-counter medications including Tylenol, Mucinex, Flonase for symptom relief.  Discussed alarm symptoms that would warrant emergent evaluation.  Strict return precautions given to which she expressed understanding.  Final Clinical Impressions(s) / UC Diagnoses   Final diagnoses:  Sinobronchitis  Productive cough     Discharge Instructions      Take antibiotics as we discussed.  Continue over-the-counter medications including Tylenol, Mucinex, Flonase for additional symptom relief.  If your symptoms are not improving please return for reevaluation.     ED Prescriptions     Medication Sig Dispense Auth. Provider   amoxicillin-clavulanate (AUGMENTIN) 875-125 MG tablet Take 1 tablet by mouth every 12 (twelve) hours. 14 tablet Stone Spirito, Noberto Retort, PA-C      PDMP not reviewed this encounter.   Jeani Hawking, PA-C 02/26/21 1332    Ercel Pepitone, Noberto Retort, PA-C 02/26/21 1349

## 2021-02-26 NOTE — ED Triage Notes (Signed)
Pt c/o cough and congest that has been on going since last weekend. Reports that mucous went from clear to a greenish. Reports over the past couple days will get sharp central chest pains and left shoulder and arm pain but they aren't related.

## 2021-02-26 NOTE — Discharge Instructions (Addendum)
Take antibiotics as we discussed.  Continue over-the-counter medications including Tylenol, Mucinex, Flonase for additional symptom relief.  If your symptoms are not improving please return for reevaluation.

## 2021-03-05 ENCOUNTER — Ambulatory Visit (INDEPENDENT_AMBULATORY_CARE_PROVIDER_SITE_OTHER): Payer: 59 | Admitting: Otolaryngology

## 2021-03-05 ENCOUNTER — Other Ambulatory Visit: Payer: Self-pay

## 2021-03-05 DIAGNOSIS — L910 Hypertrophic scar: Secondary | ICD-10-CM

## 2021-03-05 NOTE — Progress Notes (Signed)
HPI: Shelia Rivera is a 20 y.o. female who presents for evaluation of nodule in her right earlobe.  She states that the nodule in the right earlobe fluctuates some in size presently is small.  Past Medical History:  Diagnosis Date   Anemia    Chlamydia    Past Surgical History:  Procedure Laterality Date   CESAREAN SECTION N/A 05/16/2019   Procedure: CESAREAN SECTION;  Surgeon: Kathrynn Running, MD;  Location: [facility];  Service: Obstetrics;  Laterality: N/A;   FRACTURE SURGERY     Social History   Socioeconomic History   Marital status: Single    Spouse name: Not on file   Number of children: Not on file   Years of education: Not on file   Highest education level: Not on file  Occupational History   Not on file  Tobacco Use   Smoking status: Never   Smokeless tobacco: Never  Vaping Use   Vaping Use: Never used  Substance and Sexual Activity   Alcohol use: Never   Drug use: Never   Sexual activity: Not Currently  Other Topics Concern   Not on file  Social History Narrative   Not on file   Social Determinants of Health   Financial Resource Strain: Not on file  Food Insecurity: Not on file  Transportation Needs: Not on file  Physical Activity: Not on file  Stress: Not on file  Social Connections: Not on file   No family history on file. No Known Allergies Prior to Admission medications   Medication Sig Start Date End Date Taking? Authorizing Provider  amoxicillin-clavulanate (AUGMENTIN) 875-125 MG tablet Take 1 tablet by mouth every 12 (twelve) hours. 02/26/21   Raspet, Erin K, PA-C     Positive ROS: Otherwise negative  All other systems have been reviewed and were otherwise negative with the exception of those mentioned in the HPI and as above.  Physical Exam: Constitutional: Alert, well-appearing, no acute distress Ears: External right earlobe she has had 3 piercings in this ear and has a subcutaneous nodule between 2 of the piercings consistent with  probable keloid it is small measuring 3 to 4 mm in size.  She also has a piercing in the left ear with an obvious keloid on the left side.  The nodule in the right ear is nontender. Nasal: External nose without lesions.. Clear nasal passages Oral: Lips and gums without lesions. Tongue and palate mucosa without lesions. Posterior oropharynx clear. Neck: No palpable adenopathy or masses Respiratory: Breathing comfortably  Skin: No facial/neck lesions or rash noted.  Procedures  Assessment: Findings consistent with right ear keloid.  Plan: This is presently small and not apparent on visual examination until you start feeling the nodule.  Would not recommend any intervention unless this enlarges and would recommend evaluation and treatment by plastic surgery.  Narda Bonds, MD

## 2021-07-16 ENCOUNTER — Other Ambulatory Visit: Payer: Self-pay

## 2021-07-16 ENCOUNTER — Encounter: Payer: Self-pay | Admitting: Physician Assistant

## 2021-07-16 ENCOUNTER — Ambulatory Visit (INDEPENDENT_AMBULATORY_CARE_PROVIDER_SITE_OTHER): Payer: 59

## 2021-07-16 ENCOUNTER — Ambulatory Visit (INDEPENDENT_AMBULATORY_CARE_PROVIDER_SITE_OTHER): Payer: Self-pay | Admitting: Physician Assistant

## 2021-07-16 DIAGNOSIS — M25571 Pain in right ankle and joints of right foot: Secondary | ICD-10-CM

## 2021-07-16 MED ORDER — MELOXICAM 15 MG PO TABS: 15.0000 mg | ORAL_TABLET | Freq: Every day | ORAL | 0 refills | Status: DC

## 2021-07-16 NOTE — Progress Notes (Signed)
Office Visit Note   Patient: Shelia Rivera           Date of Birth: [birth year]           MRN: [id number] Visit Date: 07/16/2021              Requested by: No referring provider defined for this encounter. PCP: System, Provider Not In  Chief Complaint  Patient presents with   Right Ankle - Injury    DOI 07/15/2021      HPI: Patient is a pleasant 21 year old woman who is 8 months status post right ankle fracture.  She says this was treated with a cam walker boot for 6 weeks.  She admits she had intermittent use of the because she was going [place] but did wear it when she returned home.  Her x-rays for this were done at [facility].  She did not have any physical therapy.  She describes the injury as an inversion injury.  She is having episodes of pain in the ankle joint itself.  She also feels like she gets some swelling.  Denies any reinjury  Assessment & Plan: Visit Diagnoses:  1. Pain in right ankle and joints of right foot     Plan: Right ankle pain status post ankle fracture.  I do not have access to the x-rays for this but certainly her x-rays today are reassuring.  I do think she has some irritation within the ankle joint itself.  I talked to her about physical therapy.  She could also try an oral anti-inflammatory such as meloxicam.  If she would like I certainly can give her an intra-articular injection today.  She would like to try an anti-inflammatory first orally as well as some physical therapy.  I think this is a good idea.  If she does not get improvement she will follow-up I would recommend an MRI at that time or an injection  Follow-Up Instructions: No follow-ups on file.   Ortho Exam  Patient is alert, oriented, no adenopathy, well-dressed, normal affect, normal respiratory effort. Examination of her right ankle mild soft tissue swelling no erythema no cellulitis.  She has some translation on anterior draw but no more so than on her unaffected ankle.  Pulses are  easily palpable.  She good has good strong flexion dorsally plantarflexion inversion and eversion.  With plantarflexion she does reproduce some of the signs and pain in the ankle joint itself.  No tenderness over the distal fibula negative squeeze test no tenderness over the proximal fibula  Imaging: XR Ankle Complete Right  Result Date: 07/16/2021 Three-view radiographs of her right ankle demonstrate well-maintained alignment through the mortise.  He has no evidence of an acute fracture.  No images are attached to the encounter.  Labs: Lab Results  Component Value Date   REPTSTATUS 05/29/2019 FINAL 05/28/2019   CULT  05/28/2019    NO GROWTH Performed at [facility], [address] [place], [place] [address]      Lab Results  Component Value Date   ALBUMIN 2.7 (L) 05/28/2019   ALBUMIN 2.1 (L) 05/20/2019   ALBUMIN 1.9 (L) 05/19/2019    No results found for: MG No results found for: VD25OH  No results found for: PREALBUMIN CBC EXTENDED Latest Ref Rng & Units 08/26/2020 05/28/2019 05/20/2019  WBC 4.5 - 13.5 K/uL 6.8 9.9 9.4  RBC 3.80 - 5.70 Mil/uL 4.96 3.65(L) 3.67(L)  HGB 12.0 - 16.0 g/dL 78.5 8.8(F) 0.2(D)  HCT 36.0 - 49.0 %  40.8 28.6(L) 27.5(L)  PLT 150.0 - 575.0 K/uL 257.0 601(H) 321  NEUTROABS 1.4 - 7.7 K/uL 3.7 5.8 6.5  LYMPHSABS 0.7 - 4.0 K/uL 2.6 2.9 1.5     There is no height or weight on file to calculate BMI.  Orders:  Orders Placed This Encounter  Procedures   XR Ankle Complete Right   Ambulatory referral to Physical Therapy   Meds ordered this encounter  Medications   meloxicam (MOBIC) 15 MG tablet    Sig: Take 1 tablet (15 mg total) by mouth daily.    Dispense:  30 tablet    Refill:  0     Procedures: No procedures performed  Clinical Data: No additional findings.  ROS:  All other systems negative, except as noted in the HPI. Review of Systems  Objective: Vital Signs: There were no vitals taken for this visit.  Specialty  Comments:  No specialty comments available.  PMFS History: Patient Active Problem List   Diagnosis Date Noted   Labor and delivery, indication for care 05/16/2019   S/P primary low transverse C-section 05/16/2019   Sepsis (HCC) 05/16/2019   Past Medical History:  Diagnosis Date   Anemia    Chlamydia     History reviewed. No pertinent family history.  Past Surgical History:  Procedure Laterality Date   CESAREAN SECTION N/A 05/16/2019   Procedure: CESAREAN SECTION;  Surgeon: Kathrynn Running, MD;  Location: [facility];  Service: Obstetrics;  Laterality: N/A;   FRACTURE SURGERY     Social History   Occupational History   Not on file  Tobacco Use   Smoking status: Never   Smokeless tobacco: Never  Vaping Use   Vaping Use: Never used  Substance and Sexual Activity   Alcohol use: Never   Drug use: Never   Sexual activity: Not Currently

## 2021-07-30 ENCOUNTER — Other Ambulatory Visit: Payer: Self-pay

## 2021-07-30 ENCOUNTER — Ambulatory Visit (INDEPENDENT_AMBULATORY_CARE_PROVIDER_SITE_OTHER): Payer: 59 | Admitting: Physical Therapy

## 2021-07-30 ENCOUNTER — Encounter: Payer: Self-pay | Admitting: Physical Therapy

## 2021-07-30 DIAGNOSIS — M25571 Pain in right ankle and joints of right foot: Secondary | ICD-10-CM | POA: Diagnosis not present

## 2021-07-30 DIAGNOSIS — M357 Hypermobility syndrome: Secondary | ICD-10-CM

## 2021-07-30 NOTE — Therapy (Signed)
?OUTPATIENT PHYSICAL THERAPY LOWER EXTREMITY EVALUATION ? ? ?Patient Name: Shelia Rivera ?MRN: [id number] ?DOB:07-30-2000, 21 y.o., female ?Today's Date: 07/30/2021 ? ? PT End of Session - 07/30/21 1422   ? ? Visit Number 1   ? Number of Visits 4   ? Date for PT Re-Evaluation 08/27/21   ? PT Start Time 1352   ? PT Stop Time 1418   ? PT Time Calculation (min) 26 min   ? Activity Tolerance Patient tolerated treatment well   ? Behavior During Therapy [facility] for tasks assessed/performed   ? ?  ?  ? ?  ? ? ?Past Medical History:  ?Diagnosis Date  ? Anemia   ? Chlamydia   ? ?Past Surgical History:  ?Procedure Laterality Date  ? CESAREAN SECTION N/A 05/16/2019  ? Procedure: CESAREAN SECTION;  Surgeon: Kathrynn Running, MD;  Location: [facility];  Service: Obstetrics;  Laterality: N/A;  ? FRACTURE SURGERY    ? ?Patient Active Problem List  ? Diagnosis Date Noted  ? Labor and delivery, indication for care 05/16/2019  ? S/P primary low transverse C-section 05/16/2019  ? Sepsis (HCC) 05/16/2019  ? ? ?PCP: System, Provider Not In ? ?REFERRING PROVIDER: Persons, [place], [place] ? ?REFERRING DIAG: M25.571 (ICD-10-CM) - Pain in right ankle and joints of right foot  ? ?THERAPY DIAG:  ?Pain in right ankle and joints of right foot - Plan: PT plan of care cert/re-cert ? ?Hypermobility syndrome - Plan: PT plan of care cert/re-cert ? ?ONSET DATE: acute exacerbation 1 month ago ? ?SUBJECTIVE:  ? ?SUBJECTIVE STATEMENT: ?Pt is a 21 y/o female who presents to OPPT for Rt ankle fx about 8-9 months ago.  She reports recent pain about 1 month ago when running on the treadmill.   ? ?PERTINENT HISTORY: ?none ? ?PAIN:  ?Are you having pain? No ?NPRS scale: up to 4/10 ?Pain location: ankle, foot ?Pain orientation: Right, Lateral, and Anterior  ?PAIN TYPE: acute ?Pain description:  discomfort   ?Aggravating factors: running, walking ?Relieving factors: avoiding provoking factors, rest ? ?PRECAUTIONS: None ? ?WEIGHT BEARING RESTRICTIONS No ? ?FALLS:  ?Has  patient fallen in last 6 months? No, Number of falls: n/a ? ?LIVING ENVIRONMENT: ?Lives with: lives with their family (parents and little sister) ?Lives in: House/apartment ? ?OCCUPATION: Consulting civil engineer at [employer]; Walking between classes ? ?PLOF: Independent and Leisure: walking at park, gym 5 days/wk, was running - not currently ? ?PATIENT GOALS improve pain and discomfort ? ? ?OBJECTIVE:  ? ?DIAGNOSTIC FINDINGS: xrays: fx healed ? ?PATIENT SURVEYS:  ?07/30/21: FOTO deferred  ? ?COGNITION: ? Overall cognitive status: Within functional limits for tasks assessed   ?  ? ?LE AROM/PROM: ? ?A/PROM Right ?07/30/2021 Left ?07/30/2021  ?Ankle dorsiflexion 5 2  ?Ankle plantarflexion 65 50  ?Ankle inversion 31 25  ?Ankle eversion A: 10 ?P: 18 15  ? (Blank rows = not tested) ? ?LE MMT: ? ?MMT Right ?07/30/2021 Left ?07/30/2021  ?Ankle dorsiflexion 5/5 5/5  ?Ankle plantarflexion 4/5 4/5  ?Ankle inversion 5/5 5/5  ?Ankle eversion 4/5 5/5  ? (Blank rows = not tested) ? ?Beighton Score: 7/9 ? ? ?GAIT: ?Independent ? ?TODAY'S TREATMENT: ?See HEP - discussed progression and gym modifications to address ankle strengthening ? ? ?PATIENT EDUCATION:  ?Education details: HEP ?Person educated: Patient ?Education method: Explanation, Demonstration, and Handouts ?Education comprehension: verbalized understanding, returned demonstration, and needs further education ? ? ?HOME EXERCISE PROGRAM: ?Access Code: [email or web address] ?URL: [email or web address] ?Date: 07/30/2021 ?Prepared by: Judeth Cornfield  Ashley Royalty ? ?Exercises ?Standing Heel Raise with Band - 1 x daily - 7 x weekly - 1 sets - 5-10 reps ?Single Leg Stance on Foam Pad - 1 x daily - 7 x weekly - 1 sets - 5 reps - 10-15 hold ?Single Leg Balance on BOSU? Ball - 1 x daily - 7 x weekly - 3 sets - 10 reps ? ? ?ASSESSMENT: ? ?CLINICAL IMPRESSION: ?Patient is a 21 y.o. female who was seen today for physical therapy evaluation and treatment for Rt ankle pain s/p fx ~ 8 months ago.   She  demonstrates hypermobility and mild strength deficits.  Anticipate pt can progress independently, but will see if needed. ? ? ?OBJECTIVE IMPAIRMENTS decreased strength, pain, and hypermobility .  ? ?ACTIVITY LIMITATIONS community activity.  ? ?PERSONAL FACTORS  none   ? ?REHAB POTENTIAL: Excellent ? ?CLINICAL DECISION MAKING: Stable/uncomplicated ? ?EVALUATION COMPLEXITY: Low ? ? ?GOALS: ?Goals reviewed with patient? Yes ? ?SHORT TERM GOALS: ? ?To be determined if pt returns ? ?LONG TERM GOALS: ? ?To be determined if pt returns ? ?PLAN: ?PT FREQUENCY: 1x/week, will see up to 1x/wk PRN ? ?PT DURATION: 4 weeks ? ?PLANNED INTERVENTIONS: Therapeutic exercises, Therapeutic activity, Neuromuscular re-education, Balance training, Patient/Family education, Joint mobilization, Dry Needling, Electrical stimulation, Cryotherapy, Moist heat, Taping, and Manual therapy ? ?PLAN FOR NEXT SESSION: if pt returns, reassess and tx as indicated ? ? ? ? ?Clarita Crane, PT, DPT ?07/30/21 2:24 PM ? ? ?

## 2021-08-18 ENCOUNTER — Other Ambulatory Visit: Payer: Self-pay | Admitting: Physician Assistant

## 2021-09-22 ENCOUNTER — Other Ambulatory Visit: Payer: Self-pay | Admitting: Physician Assistant

## 2022-01-20 ENCOUNTER — Ambulatory Visit
Admission: EM | Admit: 2022-01-20 | Discharge: 2022-01-20 | Disposition: A | Discharge status: Home / Self Care | Payer: Commercial Managed Care - HMO | Attending: Urgent Care | Admitting: Urgent Care

## 2022-01-20 ENCOUNTER — Encounter: Payer: Self-pay | Admitting: Emergency Medicine

## 2022-01-20 DIAGNOSIS — S66412A Strain of intrinsic muscle, fascia and tendon of left thumb at wrist and hand level, initial encounter: Secondary | ICD-10-CM | POA: Diagnosis not present

## 2022-01-20 DIAGNOSIS — M79645 Pain in left finger(s): Secondary | ICD-10-CM

## 2022-01-20 MED ORDER — NAPROXEN 500 MG PO TABS: 500.0000 mg | ORAL_TABLET | Freq: Two times a day (BID) | ORAL | 0 refills | Status: DC

## 2022-01-20 NOTE — ED Provider Notes (Signed)
[facility]   MRN: [id number] DOB: [birth year]  Subjective:   Shelia Rivera is a 21 y.o. female presenting for 4 day history of left thumb pain. Symptoms started while she was doing a [place] on a [employer]. She had significant swelling, pain and decreased ROM. It has improved but wanted to have an evaluation to make sure she resolves her left thumb pains.   No current facility-administered medications for this encounter.  Current Outpatient Medications:    amoxicillin-clavulanate (AUGMENTIN) 875-125 MG tablet, Take 1 tablet by mouth every 12 (twelve) hours. (Patient not taking: Reported on 07/30/2021), Disp: 14 tablet, Rfl: 0   meloxicam (MOBIC) 15 MG tablet, TAKE 1 TABLET (15 MG TOTAL) BY MOUTH DAILY., Disp: 30 tablet, Rfl: 0   No Known Allergies  Past Medical History:  Diagnosis Date   Anemia    Chlamydia      Past Surgical History:  Procedure Laterality Date   CESAREAN SECTION N/A 05/16/2019   Procedure: CESAREAN SECTION;  Surgeon: Kathrynn Running, MD;  Location: [facility];  Service: Obstetrics;  Laterality: N/A;   FRACTURE SURGERY      History reviewed. No pertinent family history.  Social History   Tobacco Use   Smoking status: Never   Smokeless tobacco: Never  Vaping Use   Vaping Use: Never used  Substance Use Topics   Alcohol use: Never   Drug use: Never    ROS   Objective:   Vitals: BP 121/70 (BP Location: Right Arm)   Pulse 89   Temp 98.4 F (36.9 C) (Oral)   Resp 16   SpO2 98%   Physical Exam Constitutional:      General: She is not in acute distress.    Appearance: Normal appearance. She is well-developed. She is not ill-appearing, toxic-appearing or diaphoretic.  HENT:     Head: Normocephalic and atraumatic.     Nose: Nose normal.     Mouth/Throat:     Mouth: Mucous membranes are moist.  Eyes:     General: No scleral icterus.       Right eye: No discharge.        Left eye: No discharge.      Extraocular Movements: Extraocular movements intact.  Cardiovascular:     Rate and Rhythm: Normal rate.  Pulmonary:     Effort: Pulmonary effort is normal.  Musculoskeletal:     Left hand: Tenderness (over radial dorsal aspect, negative Finkelstein test) present. No swelling, deformity, lacerations or bony tenderness. Decreased range of motion (full passive ROM, decreased active secondary to pain). Normal strength. Normal sensation. Normal capillary refill. Normal pulse.  Skin:    General: Skin is warm and dry.  Neurological:     General: No focal deficit present.     Mental Status: She is alert and oriented to person, place, and time.  Psychiatric:        Mood and Affect: Mood normal.        Behavior: Behavior normal.     Assessment and Plan :   PDMP not reviewed this encounter.  1. Strain of intrinsic muscle of left thumb   2. Pain of left thumb    Overall exam is very reassuring. Deferred imaging as there is no suspicion of fracture, dislocation. I applied an Ace wrap in thumb spica fashion. Recommended rest, naproxen for management of a left thumb strain. Counseled patient on potential for adverse effects with medications prescribed/recommended today, ER and return-to-clinic  precautions discussed, patient verbalized understanding.    Wallis Bamberg, [place] 01/21/22 [phone]

## 2022-01-20 NOTE — ED Triage Notes (Signed)
Pt here with left thumb pain after RDLs at the gym 4 days ago.

## 2022-05-20 ENCOUNTER — Ambulatory Visit (INDEPENDENT_AMBULATORY_CARE_PROVIDER_SITE_OTHER): Payer: Commercial Managed Care - HMO | Admitting: Nurse Practitioner

## 2022-05-20 ENCOUNTER — Telehealth: Payer: Self-pay

## 2022-05-20 ENCOUNTER — Encounter: Payer: Self-pay | Admitting: Nurse Practitioner

## 2022-05-20 VITALS — BP 118/72 | HR 82 | Ht 59.5 in | Wt 139.8 lb

## 2022-05-20 DIAGNOSIS — Z833 Family history of diabetes mellitus: Secondary | ICD-10-CM

## 2022-05-20 DIAGNOSIS — R140 Abdominal distension (gaseous): Secondary | ICD-10-CM | POA: Diagnosis not present

## 2022-05-20 DIAGNOSIS — L918 Other hypertrophic disorders of the skin: Secondary | ICD-10-CM | POA: Diagnosis not present

## 2022-05-20 DIAGNOSIS — D500 Iron deficiency anemia secondary to blood loss (chronic): Secondary | ICD-10-CM

## 2022-05-20 LAB — CBC WITH DIFFERENTIAL/PLATELET
Basophils Absolute: 0 10*3/uL (ref 0.0–0.1)
Basophils Relative: 0.5 % (ref 0.0–3.0)
Eosinophils Absolute: 0.1 10*3/uL (ref 0.0–0.7)
Eosinophils Relative: 2 % (ref 0.0–5.0)
HCT: 37.1 % (ref 36.0–46.0)
Hemoglobin: 12.1 g/dL (ref 12.0–15.0)
Lymphocytes Relative: 47.5 % — ABNORMAL HIGH (ref 12.0–46.0)
Lymphs Abs: 2.1 10*3/uL (ref 0.7–4.0)
MCHC: 32.5 g/dL (ref 30.0–36.0)
MCV: 77.8 fl — ABNORMAL LOW (ref 78.0–100.0)
Monocytes Absolute: 0.3 10*3/uL (ref 0.1–1.0)
Monocytes Relative: 7.7 % (ref 3.0–12.0)
Neutro Abs: 1.8 10*3/uL (ref 1.4–7.7)
Neutrophils Relative %: 42.3 % — ABNORMAL LOW (ref 43.0–77.0)
Platelets: 280 10*3/uL (ref 150.0–400.0)
RBC: 4.76 Mil/uL (ref 3.87–5.11)
RDW: 14.8 % (ref 11.5–15.5)
WBC: 4.4 10*3/uL (ref 4.0–10.5)

## 2022-05-20 LAB — BASIC METABOLIC PANEL
BUN: 11 mg/dL (ref 6–23)
CO2: 24 mEq/L (ref 19–32)
Calcium: 9.6 mg/dL (ref 8.4–10.5)
Chloride: 105 mEq/L (ref 96–112)
Creatinine, Ser: 0.57 mg/dL (ref 0.40–1.20)
GFR: 129.8 mL/min (ref >60.00)
Glucose, Bld: 84 mg/dL (ref 70–99)
Potassium: 4.1 mEq/L (ref 3.5–5.1)
Sodium: 140 mEq/L (ref 135–145)

## 2022-05-20 LAB — HEMOGLOBIN A1C: Hgb A1c MFr Bld: 5.5 % (ref 4.6–6.5)

## 2022-05-20 NOTE — Progress Notes (Signed)
New patient visit  Patient: Shelia Rivera   DOB: 02/17/2001   21 y.o. Female  MRN: [id number] Visit Date: 05/23/2022  Subjective:    Chief Complaint  Patient presents with   New Patient (Initial Visit)    Skin tags to fold of buttocks-want to discuss. No other concerns. Fasting this morning.   [name] is a 21 y.o. female who presents today as a new patient to establish care.  HPI  Abdominal bloating Hx of bloating since age 10, onset with food intake Intermittent constipation BM 3x/week. Hx of rectal bleed in last 1year, EGD and colonoscopy completed (no abnormal finding). now resolved Hx of hemorrohoids.  Eliminate diary x 2weeks. if bloating does not improve, eliminate gluten x 2weeks. If no improvement call office for referral to GI.  Iron deficiency anemia due to chronic blood loss Unable to tolerate oral iron supplement: constipation. LMP 05/15/2022, 5-7day, every 28-30days. No menorrhagia, some clots, some dysmenorrhia 1st 2days of cycle.  Repeat CBC and iron panel today:  start oral iron supplement 1tab daily with food. Also start colace 1tab bID to prevent constipation. Schedule lab appt to repeat iron panel in 85month  Skin tag Onset 66month ago, between thighs   Wt Readings from Last 3 Encounters:  05/20/22 139 lb 12.8 oz (63.4 kg)  10/13/20 136 lb (61.7 kg) (63 %, Z= 0.34)*  08/26/20 133 lb (60.3 kg) (59 %, Z= 0.22)*   * Growth percentiles are based on CDC (Girls, 2-20 Years) data.    Most recent fall risk assessment:    05/20/2022    9:34 AM  Fall Risk   Falls in the past year? 0  Number falls in past yr: 0  Injury with Fall? 0   Most recent depression screenings:    05/20/2022    9:34 AM  PHQ 2/9 Scores  PHQ - 2 Score 0   Abdominal bloating Hx of bloating since age 43, onset with food intake Intermittent constipation BM 3x/week. Hx of rectal bleed in last 1year, EGD and colonoscopy completed (no abnormal finding). now resolved Hx of  hemorrohoids.  Eliminate diary x 2weeks. if bloating does not improve, eliminate gluten x 2weeks. If no improvement call office for referral to GI.  Iron deficiency anemia due to chronic blood loss Unable to tolerate oral iron supplement: constipation. LMP 05/15/2022, 5-7day, every 28-30days. No menorrhagia, some clots, some dysmenorrhia 1st 2days of cycle.  Repeat CBC and iron panel today:  start oral iron supplement 1tab daily with food. Also start colace 1tab bID to prevent constipation. Schedule lab appt to repeat iron panel in 38month  Skin tag Onset 2month ago, between thighs  Past Medical History:  Diagnosis Date   Anemia    Chlamydia    Past Surgical History:  Procedure Laterality Date   CESAREAN SECTION N/A 05/16/2019   Procedure: CESAREAN SECTION;  Surgeon: Kathrynn Running, MD;  Location: [facility];  Service: Obstetrics;  Laterality: N/A;   FRACTURE SURGERY Right    right ankle   Social History   Tobacco Use   Smoking status: Never   Smokeless tobacco: Never  Vaping Use   Vaping Use: Never used  Substance Use Topics   Alcohol use: Never   Drug use: Never   Family History  Problem Relation Age of Onset   Hyperlipidemia Mother    Thyroid nodules Mother    Hypertension Father    Diabetes Maternal Aunt    Cancer Maternal Uncle 95  leukemia   Cancer Paternal Aunt        brain   Cancer Maternal Grandmother 3       ovarian         05/22/2019    7:30 AM 05/28/2019    8:42 PM 02/26/2021    9:56 AM 01/20/2022    7:56 PM 05/20/2022    9:34 AM  Fall Risk  Falls in the past year?     0  Was there an injury with Fall?     0  Fall Risk Category Calculator     0  Fall Risk Category     Low  Patient Fall Risk Level Low fall risk Low fall risk Low fall risk Low fall risk    Outpatient Medications Prior to Visit  Medication Sig   [DISCONTINUED] amoxicillin-clavulanate (AUGMENTIN) 875-125 MG tablet Take 1 tablet by mouth every 12 (twelve) hours.  (Patient not taking: Reported on 07/30/2021)   [DISCONTINUED] meloxicam (MOBIC) 15 MG tablet TAKE 1 TABLET (15 MG TOTAL) BY MOUTH DAILY. (Patient not taking: Reported on 05/20/2022)   [DISCONTINUED] naproxen (NAPROSYN) 500 MG tablet Take 1 tablet (500 mg total) by mouth 2 (two) times daily with a meal. (Patient not taking: Reported on 05/20/2022)   No facility-administered medications prior to visit.   No Known Allergies  Patient Care Team: Ianmichael Amescua, Bonna Gains, NP as PCP - General (Internal Medicine)  Review of Systems     Objective:  BP 118/72 (BP Location: Right Arm, Patient Position: Sitting)   Pulse 82   Ht 4' 11.5" (1.511 m)   Wt 139 lb 12.8 oz (63.4 kg)   LMP 05/15/2022 (Exact Date)   SpO2 99%   Breastfeeding No   BMI 27.76 kg/m     Physical Exam Vitals reviewed.  Cardiovascular:     Rate and Rhythm: Normal rate and regular rhythm.     Pulses: Normal pulses.     Heart sounds: Normal heart sounds.  Pulmonary:     Effort: Pulmonary effort is normal.     Breath sounds: Normal breath sounds.  Neurological:     Mental Status: She is alert and oriented to person, place, and time.     Results for orders placed or performed in visit on 05/20/22  CBC with Differential/Platelet  Result Value Ref Range   WBC 4.4 4.0 - 10.5 K/uL   RBC 4.76 3.87 - 5.11 Mil/uL   Hemoglobin 12.1 12.0 - 15.0 g/dL   HCT 22.9 79.8 - 92.1 %   MCV 77.8 (L) 78.0 - 100.0 fl   MCHC 32.5 30.0 - 36.0 g/dL   RDW 19.4 17.4 - 08.1 %   Platelets 280.0 150.0 - 400.0 K/uL   Neutrophils Relative % 42.3 (L) 43.0 - 77.0 %   Lymphocytes Relative 47.5 (H) 12.0 - 46.0 %   Monocytes Relative 7.7 3.0 - 12.0 %   Eosinophils Relative 2.0 0.0 - 5.0 %   Basophils Relative 0.5 0.0 - 3.0 %   Neutro Abs 1.8 1.4 - 7.7 K/uL   Lymphs Abs 2.1 0.7 - 4.0 K/uL   Monocytes Absolute 0.3 0.1 - 1.0 K/uL   Eosinophils Absolute 0.1 0.0 - 0.7 K/uL   Basophils Absolute 0.0 0.0 - 0.1 K/uL  Iron, TIBC and Ferritin Panel  Result  Value Ref Range   Iron 45 40 - 190 mcg/dL   TIBC 448 185 - 631 mcg/dL (calc)   %SAT 11 (L) 16 - 45 % (calc)   Ferritin 3 (L)  16 - 154 ng/mL  Basic metabolic panel  Result Value Ref Range   Sodium 140 135 - 145 mEq/L   Potassium 4.1 3.5 - 5.1 mEq/L   Chloride 105 96 - 112 mEq/L   CO2 24 19 - 32 mEq/L   Glucose, Bld 84 70 - 99 mg/dL   BUN 11 6 - 23 mg/dL   Creatinine, Ser 4.17 0.40 - 1.20 mg/dL   GFR 408.14 >48.18 mL/min   Calcium 9.6 8.4 - 10.5 mg/dL  Hemoglobin H6D  Result Value Ref Range   Hgb A1c MFr Bld 5.5 4.6 - 6.5 %      Assessment & Plan:    Problem List Items Addressed This Visit       Musculoskeletal and Integument   Skin tag    Onset 77month ago, between thighs      Relevant Orders   Hemoglobin A1c (Completed)     Other   Abdominal bloating    Hx of bloating since age 89, onset with food intake Intermittent constipation BM 3x/week. Hx of rectal bleed in last 1year, EGD and colonoscopy completed (no abnormal finding). now resolved Hx of hemorrohoids.  Eliminate diary x 2weeks. if bloating does not improve, eliminate gluten x 2weeks. If no improvement call office for referral to GI.      Relevant Orders   Basic metabolic panel (Completed)   Iron deficiency anemia due to chronic blood loss - Primary    Unable to tolerate oral iron supplement: constipation. LMP 05/15/2022, 5-7day, every 28-30days. No menorrhagia, some clots, some dysmenorrhia 1st 2days of cycle.  Repeat CBC and iron panel today:  start oral iron supplement 1tab daily with food. Also start colace 1tab bID to prevent constipation. Schedule lab appt to repeat iron panel in 30month      Relevant Orders   CBC with Differential/Platelet (Completed)   Iron, TIBC and Ferritin Panel (Completed)   Iron, TIBC and Ferritin Panel   Other Visit Diagnoses     Family history of diabetes mellitus (DM)          Return in about 2 weeks (around 06/03/2022) for skin tag removal.      Alysia Penna, NP

## 2022-05-20 NOTE — Patient Instructions (Addendum)
Thank you for choosing [facility]  Go to lab for blood draw  Eliminate diary x 2weeks. if bloating does not improve, eliminate gluten x 2weeks. If no improvement call office for referral to GI.

## 2022-05-20 NOTE — Telephone Encounter (Signed)
LMTCB. AS, CMA 

## 2022-05-21 LAB — IRON,TIBC AND FERRITIN PANEL
%SAT: 11 % (calc) — ABNORMAL LOW (ref 16–45)
Ferritin: 3 ng/mL — ABNORMAL LOW (ref 16–154)
Iron: 45 ug/dL (ref 40–190)
TIBC: 420 mcg/dL (calc) (ref 250–450)

## 2022-05-23 ENCOUNTER — Encounter: Payer: Self-pay | Admitting: Nurse Practitioner

## 2022-05-23 NOTE — Assessment & Plan Note (Addendum)
Unable to tolerate oral iron supplement: constipation. LMP 05/15/2022, 5-7day, every 28-30days. No menorrhagia, some clots, some dysmenorrhia 1st 2days of cycle.  Repeat CBC and iron panel today:  start oral iron supplement 1tab daily with food. Also start colace 1tab bID to prevent constipation. Schedule lab appt to repeat iron panel in 41month

## 2022-05-23 NOTE — Assessment & Plan Note (Addendum)
Hx of bloating since age 21, onset with food intake Intermittent constipation BM 3x/week. Hx of rectal bleed in last 1year, EGD and colonoscopy completed (no abnormal finding). now resolved Hx of hemorrohoids.  Eliminate diary x 2weeks. if bloating does not improve, eliminate gluten x 2weeks. If no improvement call office for referral to GI.

## 2022-05-23 NOTE — Assessment & Plan Note (Signed)
Onset 16month ago, between thighs

## 2022-06-07 IMAGING — DX DG CHEST 2V
2 series · 2 of 2 positions shown · non-contrast
Comparison: 05/20/2019

CLINICAL DATA: Shortness of breath, productive cough

EXAM:
CHEST - 2 VIEW

[chest pa]
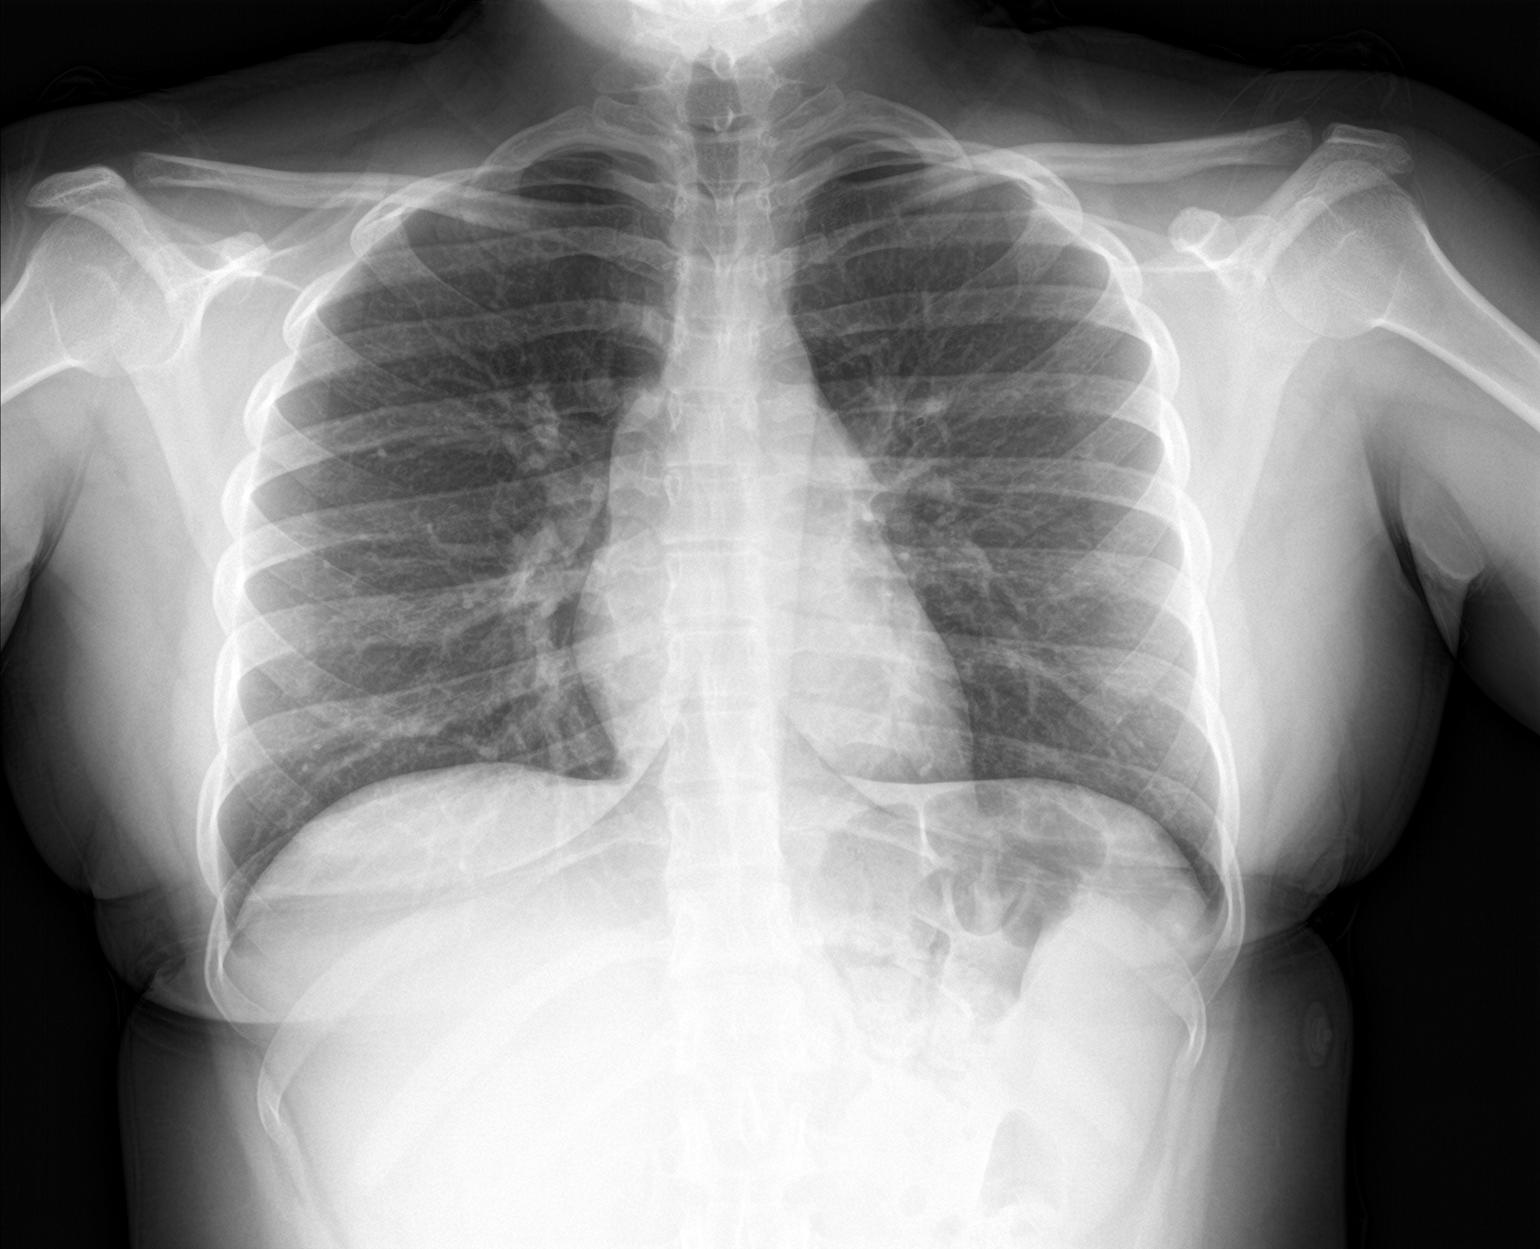

[chest lat]
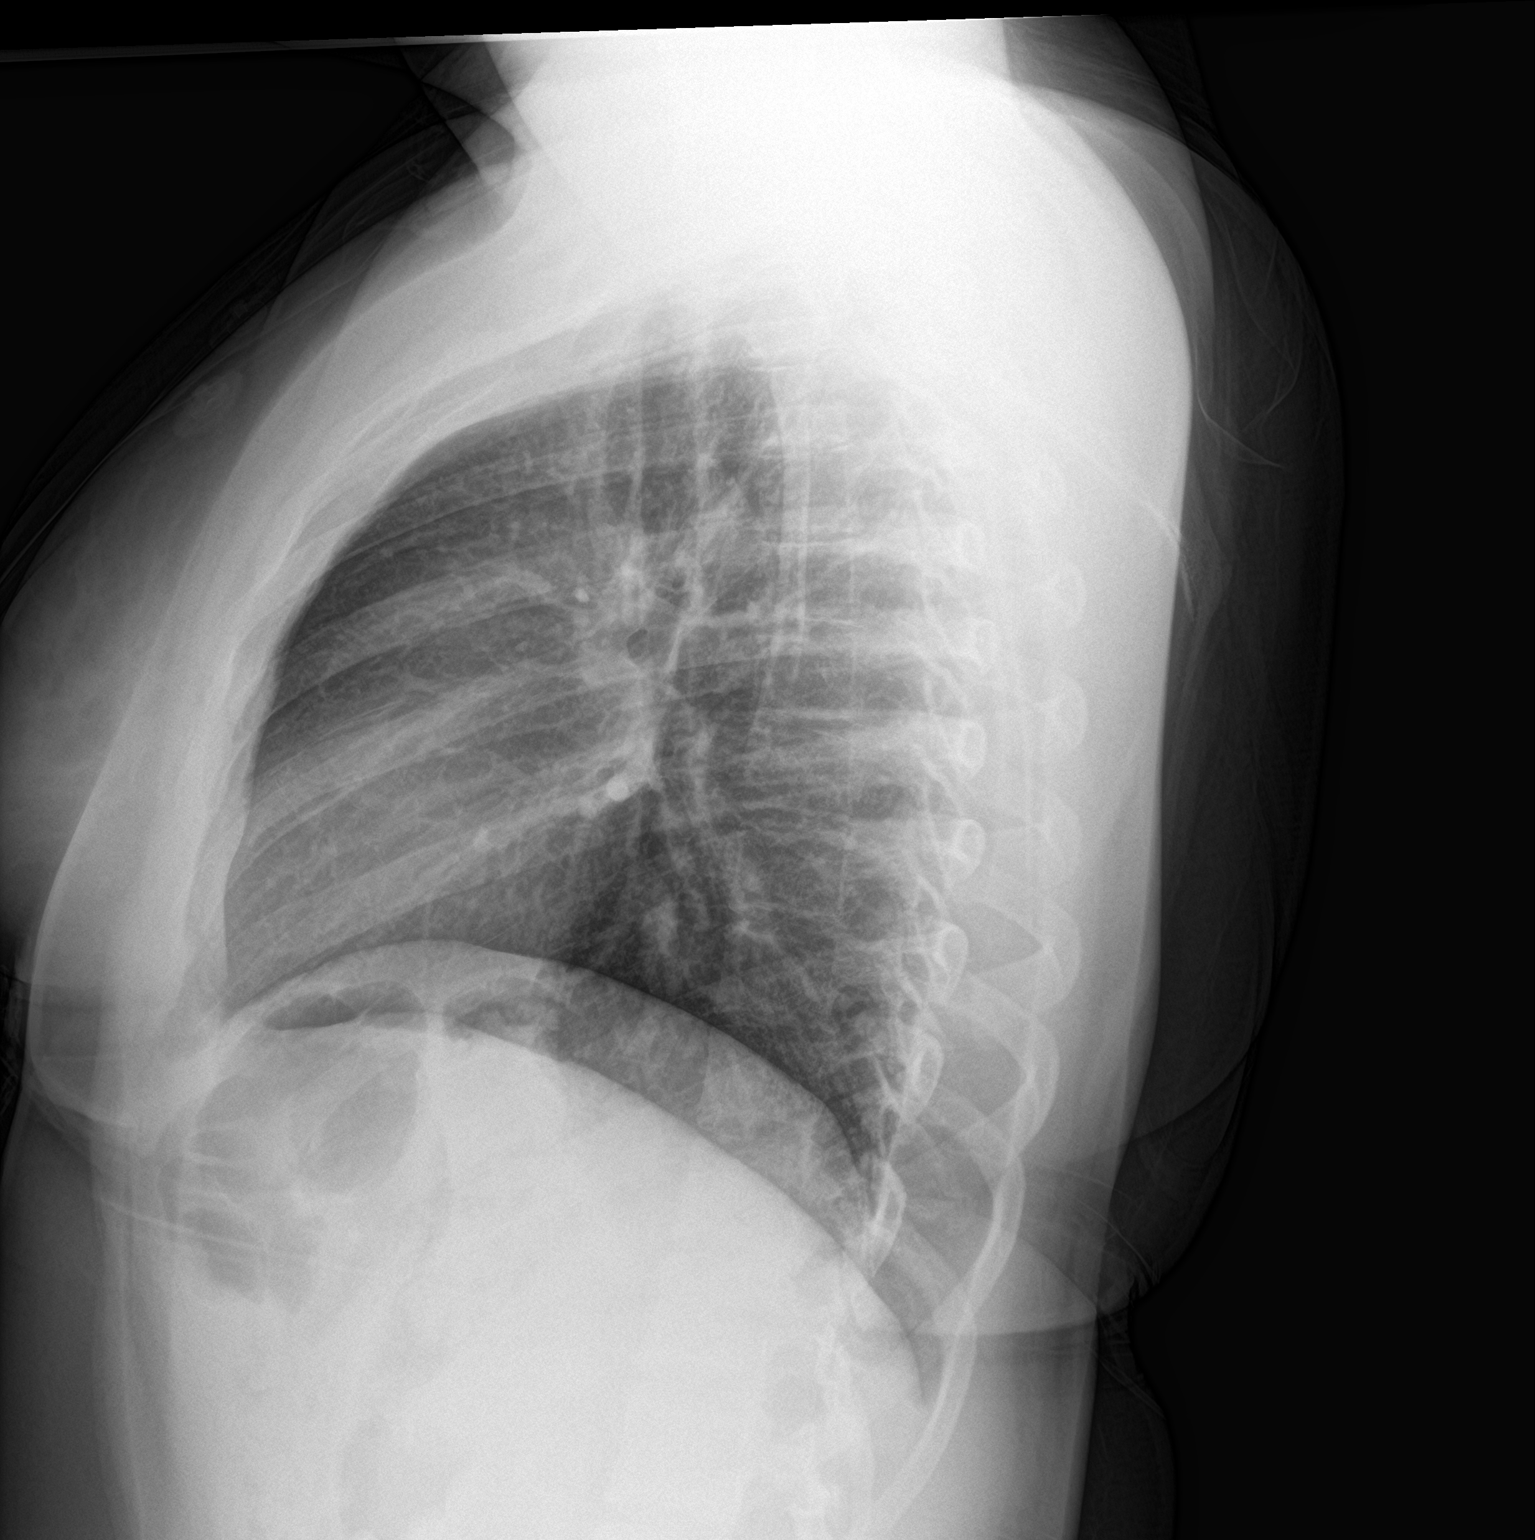

[2 of 2 positions shown; findings below may reference images not displayed]

FINDINGS: The heart size and mediastinal contours are within normal limits.
Both lungs are clear. The visualized skeletal structures are
unremarkable.
IMPRESSION: No acute abnormality of the lungs.

## 2022-06-15 ENCOUNTER — Ambulatory Visit: Payer: Commercial Managed Care - HMO | Admitting: Nurse Practitioner

## 2022-06-25 ENCOUNTER — Ambulatory Visit (INDEPENDENT_AMBULATORY_CARE_PROVIDER_SITE_OTHER): Payer: Commercial Managed Care - HMO | Admitting: Nurse Practitioner

## 2022-06-25 ENCOUNTER — Encounter: Payer: Self-pay | Admitting: Nurse Practitioner

## 2022-06-25 VITALS — BP 102/70 | HR 80 | Temp 98.1°F | Ht 59.5 in | Wt 139.6 lb

## 2022-06-25 DIAGNOSIS — R140 Abdominal distension (gaseous): Secondary | ICD-10-CM | POA: Diagnosis not present

## 2022-06-25 DIAGNOSIS — B081 Molluscum contagiosum: Secondary | ICD-10-CM

## 2022-06-25 DIAGNOSIS — L918 Other hypertrophic disorders of the skin: Secondary | ICD-10-CM

## 2022-06-25 NOTE — Progress Notes (Signed)
Established Patient Visit  Patient: Shelia Rivera   DOB: 11-Sep-2000   21 y.o. Female  MRN: [id number] Visit Date: 06/25/2022  Subjective:    Chief Complaint  Patient presents with   Office Visit    F/u needs skin tags removed  No there concerns    HPI Abdominal bloating Improved with elimination of diary  Acrochordon 3 on medial aspect of right thigh and 1 on medial aspect of left thigh. They varry is size but all <0.5cm Reports increased irritation and discoloration for the last 49month.  Procedure including risks/benefits explained to patient.   Questions were answered.  After informed and written consent was obtained and a time out completed. Site was cleansed with betadine and then alcohol. 1% Lidocaine with epinephrine was injected under lesion and then shave biopsy was performed. Area was cauterized with silver nitrate sticks to obtain hemostasis.  Pt tolerated procedure well.  Specimen sent for pathology review.  Pt instructed to keep the area dry for 24 hours and to contact us if he develops redness, drainage or swelling at the site.  Pt may use tylenol as needed for discomfort today.    Reviewed medical, surgical, and social history today  Medications: Outpatient Medications Prior to Visit  Medication Sig   ferrous sulfate 324 (65 Fe) MG TBEC Take 1 tablet by mouth daily after breakfast.   No facility-administered medications prior to visit.   Reviewed past medical and social history.   ROS per HPI above      Objective:  BP 102/70 (BP Location: Right Arm, Patient Position: Sitting, Cuff Size: Normal)   Pulse 80   Temp 98.1 F (36.7 C) (Temporal)   Ht 4' 11.5" (1.511 m)   Wt 139 lb 9.6 oz (63.3 kg)   SpO2 99%   BMI 27.72 kg/m      Physical Exam Cardiovascular:     Rate and Rhythm: Normal rate.     Pulses: Normal pulses.  Pulmonary:     Effort: Pulmonary effort is normal.  Genitourinary:   Skin:    Findings: Lesion present. No  erythema.  Neurological:     Mental Status: She is alert and oriented to person, place, and time.     No results found for any visits on 06/25/22.    Assessment & Plan:    Problem List Items Addressed This Visit       Musculoskeletal and Integument   Acrochordon - Primary    3 on medial aspect of right thigh and 1 on medial aspect of left thigh. They varry is size but all <0.5cm Reports increased irritation and discoloration for the last 19month.  Procedure including risks/benefits explained to patient.   Questions were answered.  After informed and written consent was obtained and a time out completed. Site was cleansed with betadine and then alcohol. 1% Lidocaine with epinephrine was injected under lesion and then shave biopsy was performed. Area was cauterized with silver nitrate sticks to obtain hemostasis.  Pt tolerated procedure well.  Specimen sent for pathology review.  Pt instructed to keep the area dry for 24 hours and to contact us if he develops redness, drainage or swelling at the site.  Pt may use tylenol as needed for discomfort today.        Relevant Orders   Pathology ([facility])     Other   Abdominal bloating    Improved with elimination of diary  Return if symptoms worsen or fail to improve.     Wilfred Lacy, NP

## 2022-06-25 NOTE — Assessment & Plan Note (Signed)
Improved with elimination of diary

## 2022-06-25 NOTE — Patient Instructions (Signed)
Skin Biopsy, Care After The following information offers guidance on how to care for yourself after your procedure. Your health care provider may also give you more specific instructions. If you have problems or questions, contact your health care provider. What can I expect after the procedure? After the procedure, it is common to have: Soreness or mild pain. Bruising. Itching. Some redness and swelling. Follow these instructions at home: Biopsy site care  Follow instructions from your health care provider about how to take care of your biopsy site. Make sure you: Wash your hands with soap and water for at least 20 seconds before and after you change your bandage (dressing). If soap and water are not available, use hand sanitizer. Change your dressing as told by your health care provider. Leave stitches (sutures), skin glue, or adhesive strips in place. These skin closures may need to stay in place for 2 weeks or longer. If adhesive strip edges start to loosen and curl up, you may trim the loose edges. Do not remove adhesive strips completely unless your health care provider tells you to do that. Check your biopsy site every day for signs of infection. Check for: More redness, swelling, or pain. Fluid or blood. Warmth. Pus or a bad smell. Do not take baths, swim, or use a hot tub until your health care provider approves. Ask your health care provider if you may take showers. You may only be allowed to take sponge baths. General instructions Take over-the-counter and prescription medicines only as told by your health care provider. Return to your normal activities as told by your health care provider. Ask your health care provider what activities are safe for you. Keep all follow-up visits. This is important. Contact a health care provider if: You have more redness, swelling, or pain around your biopsy site. You have fluid or blood coming from your biopsy site. Your biopsy site feels warm  to the touch. You have pus or a bad smell coming from your biopsy site. You have a fever. Your sutures, skin glue, or adhesive strips loosen or come off sooner than expected. Get help right away if: You have bleeding that does not stop with pressure or a dressing. Summary After the procedure, it is common to have soreness, bruising, and itching at the site. Follow instructions from your health care provider about how to take care of your biopsy site. Check your biopsy site every day for signs of infection. Contact a health care provider if you have more redness, swelling, or pain around your biopsy site, or your biopsy site feels warm to the touch. Keep all follow-up visits. This is important. This information is not intended to replace advice given to you by your health care provider. Make sure you discuss any questions you have with your health care provider. Document Revised: 12/16/2020 Document Reviewed: 12/16/2020 Elsevier Patient Education  2023 [employer]  

## 2022-06-25 NOTE — Assessment & Plan Note (Addendum)
3 on medial aspect of right thigh and 1 on medial aspect of left thigh. They varry is size but all <0.5cm Reports increased irritation and discoloration for the last 38month.  Procedure including risks/benefits explained to patient.   Questions were answered.  After informed and written consent was obtained and a time out completed. Site was cleansed with betadine and then alcohol. 1% Lidocaine with epinephrine was injected under lesion and then shave biopsy was performed. Area was cauterized with silver nitrate sticks to obtain hemostasis.  Pt tolerated procedure well.  Specimen sent for pathology review.  Pt instructed to keep the area dry for 24 hours and to contact us if he develops redness, drainage or swelling at the site.  Pt may use tylenol as needed for discomfort today.

## 2022-06-30 ENCOUNTER — Other Ambulatory Visit: Payer: Self-pay | Admitting: Nurse Practitioner

## 2022-06-30 DIAGNOSIS — B081 Molluscum contagiosum: Secondary | ICD-10-CM

## 2022-06-30 LAB — ANATOMIC PATHOLOGY REPORT

## 2022-06-30 MED ORDER — CANTHARIDIN 0.7 % EX SOLN: CUTANEOUS | 0 refills | Status: DC

## 2022-06-30 NOTE — Addendum Note (Signed)
Addended by: Wilfred Lacy L on: 06/30/2022 12:15 PM   Modules accepted: Orders

## 2022-07-06 ENCOUNTER — Other Ambulatory Visit: Payer: Self-pay | Admitting: Nurse Practitioner

## 2022-07-06 DIAGNOSIS — B081 Molluscum contagiosum: Secondary | ICD-10-CM

## 2022-07-06 MED ORDER — IMIQUIMOD 5 % EX CREA: TOPICAL_CREAM | CUTANEOUS | 0 refills | Status: DC

## 2022-10-25 ENCOUNTER — Ambulatory Visit
Admission: EM | Admit: 2022-10-25 | Discharge: 2022-10-25 | Disposition: A | Discharge status: Home / Self Care | Payer: Medicaid Other | Attending: Nurse Practitioner | Admitting: Nurse Practitioner

## 2022-10-25 ENCOUNTER — Ambulatory Visit (INDEPENDENT_AMBULATORY_CARE_PROVIDER_SITE_OTHER): Payer: Medicaid Other

## 2022-10-25 DIAGNOSIS — M79672 Pain in left foot: Secondary | ICD-10-CM | POA: Diagnosis not present

## 2022-10-25 DIAGNOSIS — S9032XA Contusion of left foot, initial encounter: Secondary | ICD-10-CM

## 2022-10-25 DIAGNOSIS — S99922A Unspecified injury of left foot, initial encounter: Secondary | ICD-10-CM | POA: Diagnosis not present

## 2022-10-25 NOTE — ED Triage Notes (Signed)
Pt presents to UC w/ c/o left foot pain x1 week after dropping laptop on top of foot. Pt is able to ambulate without difficulty.

## 2022-10-25 NOTE — Discharge Instructions (Addendum)
Your x-ray was negative for fracture.  Please treat your symptoms with elevation, ice, and over-the-counter ibuprofen or Tylenol as needed. Please follow-up with your PCP if your symptoms do not improve Please go to the ER for any worsening symptoms

## 2022-10-25 NOTE — ED Provider Notes (Signed)
[facility]    CSN: [id number] Arrival date & time: 10/25/22  1913      History   Chief Complaint No chief complaint on file.   HPI Shelia Rivera is a 22 y.o. female presents for evaluation of foot pain.  Patient reports 1 week ago she dropped a laptop on the top of her left foot.  Reports she did have some swelling and bruising that is improving but she continues to have pain to the dorsum of her foot especially with walking or flexion and extension.  No numbness or tingling.  No history of surgeries or fractures to this foot in the past.  She has not been using any OTC medications for treatment.  No other concerns at this time.  HPI  Past Medical History:  Diagnosis Date   Anemia    Chlamydia     Patient Active Problem List   Diagnosis Date Noted   Abdominal bloating 05/20/2022   Acrochordon 05/20/2022   Iron deficiency anemia due to chronic blood loss 05/20/2022   S/P primary low transverse C-section 05/16/2019    Past Surgical History:  Procedure Laterality Date   CESAREAN SECTION N/A 05/16/2019   Procedure: CESAREAN SECTION;  Surgeon: Kathrynn Running, MD;  Location: [facility];  Service: Obstetrics;  Laterality: N/A;   FRACTURE SURGERY Right    right ankle    OB History     Gravida  1   Para  1   Term  1   Preterm      AB      Living  1      SAB      IAB      Ectopic      Multiple  0   Live Births  1            Home Medications    Prior to Admission medications   Medication Sig Start Date End Date Taking? Authorizing Provider  ferrous sulfate 324 (65 Fe) MG TBEC Take 1 tablet by mouth daily after breakfast.    [provider]  imiquimod (ALDARA) 5 % cream Apply topically 3 (three) times a week. Till lesions resolve, no more than 16weeks, Hold if skin irritation 07/07/22   Nche, Bonna Gains, NP    Family History Family History  Problem Relation Age of Onset   Hyperlipidemia Mother    Thyroid nodules Mother     Hypertension Father    Diabetes Maternal Aunt    Cancer Maternal Uncle 76       leukemia   Cancer Paternal Aunt        brain   Cancer Maternal Grandmother 60       ovarian    Social History Social History   Tobacco Use   Smoking status: Never   Smokeless tobacco: Never  Vaping Use   Vaping Use: Never used  Substance Use Topics   Alcohol use: Never   Drug use: Never     Allergies   Patient has no known allergies.   Review of Systems Review of Systems  Musculoskeletal:        Left foot pain      Physical Exam Triage Vital Signs ED Triage Vitals  Enc Vitals Group     BP 10/25/22 1921 123/80     Pulse Rate 10/25/22 1921 84     Resp 10/25/22 1921 16     Temp 10/25/22 1921 99.2 F (37.3 C)     Temp Source  10/25/22 1921 Oral     SpO2 10/25/22 1921 99 %     Weight --      Height --      Head Circumference --      Peak Flow --      Pain Score 10/25/22 1923 5     Pain Loc --      Pain Edu? --      Excl. in GC? --    No data found.  Updated Vital Signs BP 123/80 (BP Location: Left Arm)   Pulse 84   Temp 99.2 F (37.3 C) (Oral)   Resp 16   LMP 09/30/2022 (Exact Date)   SpO2 99%   Visual Acuity Right Eye Distance:   Left Eye Distance:   Bilateral Distance:    Right Eye Near:   Left Eye Near:    Bilateral Near:     Physical Exam Vitals and nursing note reviewed.  Constitutional:      Appearance: Normal appearance.  HENT:     Head: Normocephalic and atraumatic.  Eyes:     Pupils: Pupils are equal, round, and reactive to light.  Cardiovascular:     Rate and Rhythm: Normal rate.  Pulmonary:     Effort: Pulmonary effort is normal.  Musculoskeletal:       Feet:  Feet:     Comments: No swelling or ecchymosis of the left foot.  There is tenderness to palpation to the mid dorsum of the foot that extends from the first metatarsal to fifth metatarsal.  No tenderness palpation to distal foot.  DP +2.  Pain with active extension and flexion of the  foot.  Strength 5 out of 5 bilateral lower extremities. Skin:    General: Skin is warm and dry.  Neurological:     General: No focal deficit present.     Mental Status: She is alert and oriented to person, place, and time.  Psychiatric:        Mood and Affect: Mood normal.        Behavior: Behavior normal.      UC Treatments / Results  Labs (all labs ordered are listed, but only abnormal results are displayed) Labs Reviewed - No data to display  EKG   Radiology DG Foot Complete Left  Result Date: 10/25/2022 CLINICAL DATA:  Blunt trauma. EXAM: LEFT FOOT - COMPLETE 3+ VIEW COMPARISON:  None Available. FINDINGS: There is no evidence of fracture or dislocation. There is no evidence of arthropathy or other focal bone abnormality. Soft tissues are unremarkable. IMPRESSION: Negative. Electronically Signed   By: Ted Mcalpine M.D.   On: 10/25/2022 19:37    Procedures Procedures (including critical care time)  Medications Ordered in UC Medications - No data to display  Initial Impression / Assessment and Plan / UC Course  I have reviewed the triage vital signs and the nursing notes.  Pertinent labs & imaging results that were available during my care of the patient were reviewed by me and considered in my medical decision making (see chart for details).     Reviewed exam and symptoms with patient.  X-ray negative for fracture.  Discussed soft tissue injury/contusion RICE therapy and OTC analgesics as needed PCP follow-up if symptoms do not improve ER precautions reviewed and patient verbalized understanding Final Clinical Impressions(s) / UC Diagnoses   Final diagnoses:  Left foot pain  Contusion of left foot, initial encounter     Discharge Instructions      Your x-ray was  negative for fracture.  Please treat your symptoms with elevation, ice, and over-the-counter ibuprofen or Tylenol as needed. Please follow-up with your PCP if your symptoms do not  improve Please go to the ER for any worsening symptoms     ED Prescriptions   None    PDMP not reviewed this encounter.   Radford Pax, NP 10/25/22 1944

## 2022-11-16 ENCOUNTER — Other Ambulatory Visit (HOSPITAL_COMMUNITY)
Admission: RE | Admit: 2022-11-16 | Discharge: 2022-11-16 | Disposition: A | Discharge status: Home / Self Care | Payer: Medicaid Other | Source: Ambulatory Visit | Attending: Nurse Practitioner | Admitting: Nurse Practitioner

## 2022-11-16 ENCOUNTER — Encounter: Payer: Self-pay | Admitting: Nurse Practitioner

## 2022-11-16 ENCOUNTER — Ambulatory Visit (INDEPENDENT_AMBULATORY_CARE_PROVIDER_SITE_OTHER): Payer: Medicaid Other | Admitting: Nurse Practitioner

## 2022-11-16 VITALS — BP 100/80 | HR 89 | Temp 98.3°F | Resp 16 | Ht 59.5 in | Wt 131.0 lb

## 2022-11-16 DIAGNOSIS — Z124 Encounter for screening for malignant neoplasm of cervix: Secondary | ICD-10-CM | POA: Insufficient documentation

## 2022-11-16 DIAGNOSIS — Z0000 Encounter for general adult medical examination without abnormal findings: Secondary | ICD-10-CM | POA: Insufficient documentation

## 2022-11-16 LAB — CBC
HCT: 35.9 % — ABNORMAL LOW (ref 36.0–46.0)
Hemoglobin: 11.3 g/dL — ABNORMAL LOW (ref 12.0–15.0)
MCHC: 31.5 g/dL (ref 30.0–36.0)
MCV: 77 fl — ABNORMAL LOW (ref 78.0–100.0)
Platelets: 282 10*3/uL (ref 150.0–400.0)
RBC: 4.67 Mil/uL (ref 3.87–5.11)
RDW: 15.2 % (ref 11.5–15.5)
WBC: 4.3 10*3/uL (ref 4.0–10.5)

## 2022-11-16 LAB — COMPREHENSIVE METABOLIC PANEL
ALT: 7 U/L (ref 0–35)
AST: 14 U/L (ref 0–37)
Albumin: 4.6 g/dL (ref 3.5–5.2)
Alkaline Phosphatase: 58 U/L (ref 39–117)
BUN: 9 mg/dL (ref 6–23)
CO2: 24 mEq/L (ref 19–32)
Calcium: 9.5 mg/dL (ref 8.4–10.5)
Chloride: 104 mEq/L (ref 96–112)
Creatinine, Ser: 0.64 mg/dL (ref 0.40–1.20)
GFR: 125.8 mL/min (ref >60.00)
Glucose, Bld: 85 mg/dL (ref 70–99)
Potassium: 4.2 mEq/L (ref 3.5–5.1)
Sodium: 137 mEq/L (ref 135–145)
Total Bilirubin: 0.4 mg/dL (ref 0.2–1.2)
Total Protein: 7.7 g/dL (ref 6.0–8.3)

## 2022-11-16 LAB — TSH: TSH: 1.33 u[IU]/mL (ref 0.35–5.50)

## 2022-11-16 NOTE — Progress Notes (Signed)
Complete physical exam  Patient: Shelia Rivera   DOB: 2000-08-08   22 y.o. Female  MRN: [id number] Visit Date: 11/16/2022  Subjective:    Chief Complaint  Patient presents with   Annual Exam   Shelia Rivera is a 22 y.o. female who presents today for a complete physical exam. She reports consuming a general diet.  Weight training and cardio exercise 3x/week  She generally feels well. She reports sleeping well. She does not have additional problems to discuss today.  Vision:Yes Dental:Yes STD Screen:No  BP Readings from Last 3 Encounters:  11/16/22 100/80  10/25/22 123/80  06/25/22 102/70   Wt Readings from Last 3 Encounters:  11/16/22 131 lb (59.4 kg)  06/25/22 139 lb 9.6 oz (63.3 kg)  05/20/22 139 lb 12.8 oz (63.4 kg)   Most recent fall risk assessment:    11/16/2022   10:17 AM  Fall Risk   Falls in the past year? 0  Number falls in past yr: 0  Injury with Fall? 0  Risk for fall due to : No Fall Risks  Follow up Falls evaluation completed   Depression screen:Yes - No Depression  Most recent depression screenings:    11/16/2022   10:22 AM 05/20/2022    9:34 AM  PHQ 2/9 Scores  PHQ - 2 Score 0 0   HPI  No problem-specific Assessment & Plan notes found for this encounter.  Past Medical History:  Diagnosis Date   Anemia    Chlamydia    Past Surgical History:  Procedure Laterality Date   CESAREAN SECTION N/A 05/16/2019   Procedure: CESAREAN SECTION;  Surgeon: Kathrynn Running, MD;  Location: [facility];  Service: Obstetrics;  Laterality: N/A;   FRACTURE SURGERY Right    right ankle   Social History   Socioeconomic History   Marital status: Single    Spouse name: Not on file   Number of children: Not on file   Years of education: Not on file   Highest education level: Not on file  Occupational History   Not on file  Tobacco Use   Smoking status: Never   Smokeless tobacco: Never  Vaping Use   Vaping Use: Never used  Substance and Sexual Activity    Alcohol use: Never   Drug use: Never   Sexual activity: Yes    Birth control/protection: Condom  Other Topics Concern   Not on file  Social History Narrative   Not on file   Social Determinants of Health   Financial Resource Strain: Not on file  Food Insecurity: Not on file  Transportation Needs: Not on file  Physical Activity: Not on file  Stress: Not on file  Social Connections: Not on file  Intimate Partner Violence: Not on file   Family Status  Relation Name Status   Mother  Alive   Father  Alive   Sister  Alive   Brother  Alive   Mat Aunt  Alive   Mat Uncle  Deceased   Pat Aunt  Deceased   MGM  Deceased   MGF  Deceased   PGM  Deceased   PGF  Deceased   Family History  Problem Relation Age of Onset   Hyperlipidemia Mother    Thyroid nodules Mother    Hypertension Father    Diabetes Maternal Aunt    Cancer Maternal Uncle 5       leukemia   Cancer Paternal Aunt        brain  Cancer Maternal Grandmother 71       ovarian   No Known Allergies  Patient Care Team: Zyaira Vejar, Bonna Gains, NP as PCP - General (Internal Medicine)   Medications: Outpatient Medications Prior to Visit  Medication Sig   ferrous sulfate 324 (65 Fe) MG TBEC Take 1 tablet by mouth daily after breakfast.   imiquimod (ALDARA) 5 % cream Apply topically 3 (three) times a week. Till lesions resolve, no more than 16weeks, Hold if skin irritation   No facility-administered medications prior to visit.    Review of Systems  Constitutional:  Negative for activity change, appetite change and unexpected weight change.  Respiratory: Negative.    Cardiovascular: Negative.   Gastrointestinal: Negative.   Endocrine: Negative for cold intolerance and heat intolerance.  Genitourinary: Negative.   Musculoskeletal: Negative.   Skin: Negative.   Neurological: Negative.   Hematological: Negative.   Psychiatric/Behavioral:  Negative for behavioral problems, decreased concentration, dysphoric mood,  hallucinations, self-injury, sleep disturbance and suicidal ideas. The patient is not nervous/anxious.         Objective:  BP 100/80 (BP Location: Left Arm, Patient Position: Sitting, Cuff Size: Normal)   Pulse 89   Temp 98.3 F (36.8 C) (Temporal)   Resp 16   Ht 4' 11.5" (1.511 m)   Wt 131 lb (59.4 kg)   LMP 11/01/2022 (Approximate)   SpO2 100%   BMI 26.02 kg/m     Physical Exam Vitals and nursing note reviewed. Exam conducted with a chaperone present.  Constitutional:      General: She is not in acute distress. HENT:     Right Ear: Tympanic membrane, ear canal and external ear normal.     Left Ear: Tympanic membrane, ear canal and external ear normal.     Nose: Nose normal.  Eyes:     Extraocular Movements: Extraocular movements intact.     Conjunctiva/sclera: Conjunctivae normal.     Pupils: Pupils are equal, round, and reactive to light.  Neck:     Thyroid: No thyroid mass, thyromegaly or thyroid tenderness.  Cardiovascular:     Rate and Rhythm: Normal rate and regular rhythm.     Pulses: Normal pulses.     Heart sounds: Normal heart sounds.  Pulmonary:     Effort: Pulmonary effort is normal.     Breath sounds: Normal breath sounds.  Chest:  Breasts:    Breasts are symmetrical.     Right: Normal.     Left: Normal.  Abdominal:     General: Bowel sounds are normal.     Palpations: Abdomen is soft.     Hernia: There is no hernia in the left inguinal area or right inguinal area.  Genitourinary:    General: Normal vulva.     Exam position: Lithotomy position.     Labia:        Right: No rash, tenderness or lesion.        Left: No rash, tenderness or lesion.      Urethra: No prolapse or urethral pain.     Vagina: Normal.     Cervix: Normal.     Uterus: Normal.      Adnexa: Right adnexa normal and left adnexa normal.  Musculoskeletal:        General: Normal range of motion.     Cervical back: Normal range of motion and neck supple.     Right lower leg: No  edema.     Left lower leg: No edema.  Lymphadenopathy:  Cervical: No cervical adenopathy.     Upper Body:     Right upper body: No supraclavicular, axillary or pectoral adenopathy.     Left upper body: No supraclavicular, axillary or pectoral adenopathy.     Lower Body: No right inguinal adenopathy. No left inguinal adenopathy.  Skin:    General: Skin is warm and dry.  Neurological:     Mental Status: She is alert and oriented to person, place, and time.     Cranial Nerves: No cranial nerve deficit.  Psychiatric:        Mood and Affect: Mood normal.        Behavior: Behavior normal.        Thought Content: Thought content normal.      No results found for any visits on 11/16/22.    Assessment & Plan:    Routine Health Maintenance and Physical Exam  Immunization History  Administered Date(s) Administered   PFIZER(Purple Top)SARS-COV-2 Vaccination 07/31/2019, 08/31/2019   Pfizer Covid-19 Vaccine Bivalent Booster 61yrs & up 03/17/2020    Health Maintenance  Topic Date Due   PAP-Cervical Cytology Screening  Never done   PAP SMEAR-Modifier  Never done   COVID-19 Vaccine (4 - 2023-24 season) 01/29/2022   HPV VACCINES (1 - Risk 3-dose series) 06/26/2023 (Originally 11/08/2011)   Hepatitis C Screening  11/16/2023 (Originally 11/08/2018)   INFLUENZA VACCINE  12/30/2022   HIV Screening  Completed   DTaP/Tdap/Td  Discontinued   Discussed health benefits of physical activity, and encouraged her to engage in regular exercise appropriate for her age and condition.  Problem List Items Addressed This Visit   None Visit Diagnoses     Preventative health care    -  Primary   Relevant Orders   Cytology - PAP   CBC   Comprehensive metabolic panel   TSH   Encounter for Papanicolaou smear for cervical cancer screening       Relevant Orders   Cytology - PAP      Return in about 1 year (around 11/16/2023) for CPE (fasting).     Alysia Penna, NP

## 2022-11-16 NOTE — Patient Instructions (Addendum)
Go to lab Continue Heart healthy diet and daily exercise.  Preventive Care 21-22 Years Old, Female Preventive care refers to lifestyle choices and visits with your health care provider that can promote health and wellness. Preventive care visits are also called wellness exams. What can I expect for my preventive care visit? Counseling During your preventive care visit, your health care provider may ask about your: Medical history, including: Past medical problems. Family medical history. Pregnancy history. Current health, including: Menstrual cycle. Method of birth control. Emotional well-being. Home life and relationship well-being. Sexual activity and sexual health. Lifestyle, including: Alcohol, nicotine or tobacco, and drug use. Access to firearms. Diet, exercise, and sleep habits. Work and work environment. Sunscreen use. Safety issues such as seatbelt and bike helmet use. Physical exam Your health care provider may check your: Height and weight. These may be used to calculate your BMI (body mass index). BMI is a measurement that tells if you are at a healthy weight. Waist circumference. This measures the distance around your waistline. This measurement also tells if you are at a healthy weight and may help predict your risk of certain diseases, such as type 2 diabetes and high blood pressure. Heart rate and blood pressure. Body temperature. Skin for abnormal spots. What immunizations do I need?  Vaccines are usually given at various ages, according to a schedule. Your health care provider will recommend vaccines for you based on your age, medical history, and lifestyle or other factors, such as travel or where you work. What tests do I need? Screening Your health care provider may recommend screening tests for certain conditions. This may include: Pelvic exam and Pap test. Lipid and cholesterol levels. Diabetes screening. This is done by checking your blood sugar  (glucose) after you have not eaten for a while (fasting). Hepatitis B test. Hepatitis C test. HIV (human immunodeficiency virus) test. STI (sexually transmitted infection) testing, if you are at risk. BRCA-related cancer screening. This may be done if you have a family history of breast, ovarian, tubal, or peritoneal cancers. Talk with your health care provider about your test results, treatment options, and if necessary, the need for more tests. Follow these instructions at home: Eating and drinking  Eat a healthy diet that includes fresh fruits and vegetables, whole grains, lean protein, and low-fat dairy products. Take vitamin and mineral supplements as recommended by your health care provider. Do not drink alcohol if: Your health care provider tells you not to drink. You are pregnant, may be pregnant, or are planning to become pregnant. If you drink alcohol: Limit how much you have to 0-1 drink a day. Know how much alcohol is in your drink. In the [place] one drink equals one 12 oz bottle of beer (355 mL), one 5 oz glass of wine (148 mL), or one 1 oz glass of hard liquor (44 mL). Lifestyle Brush your teeth every morning and night with fluoride toothpaste. Floss one time each day. Exercise for at least 30 minutes 5 or more days each week. Do not use any products that contain nicotine or tobacco. These products include cigarettes, chewing tobacco, and vaping devices, such as e-cigarettes. If you need help quitting, ask your health care provider. Do not use drugs. If you are sexually active, practice safe sex. Use a condom or other form of protection to prevent STIs. If you do not wish to become pregnant, use a form of birth control. If you plan to become pregnant, see your health care provider for a   prepregnancy visit. Find healthy ways to manage stress, such as: Meditation, yoga, or listening to music. Journaling. Talking to a trusted person. Spending time with friends and  family. Minimize exposure to UV radiation to reduce your risk of skin cancer. Safety Always wear your seat belt while driving or riding in a vehicle. Do not drive: If you have been drinking alcohol. Do not ride with someone who has been drinking. If you have been using any mind-altering substances or drugs. While texting. When you are tired or distracted. Wear a helmet and other protective equipment during sports activities. If you have firearms in your house, make sure you follow all gun safety procedures. Seek help if you have been physically or sexually abused. What's next? Go to your health care provider once a year for an annual wellness visit. Ask your health care provider how often you should have your eyes and teeth checked. Stay up to date on all vaccines. This information is not intended to replace advice given to you by your health care provider. Make sure you discuss any questions you have with your health care provider. Document Revised: 11/12/2020 Document Reviewed: 11/12/2020 Elsevier Patient Education  2024 [employer]  

## 2022-11-17 LAB — CYTOLOGY - PAP: Diagnosis: NEGATIVE

## 2022-11-26 DIAGNOSIS — M79672 Pain in left foot: Secondary | ICD-10-CM | POA: Diagnosis not present

## 2022-12-06 ENCOUNTER — Encounter: Payer: Self-pay | Admitting: Nurse Practitioner

## 2023-01-25 ENCOUNTER — Telehealth: Payer: Self-pay

## 2023-01-25 DIAGNOSIS — B9689 Other specified bacterial agents as the cause of diseases classified elsewhere: Secondary | ICD-10-CM | POA: Diagnosis not present

## 2023-01-25 DIAGNOSIS — N760 Acute vaginitis: Secondary | ICD-10-CM | POA: Diagnosis not present

## 2023-01-25 DIAGNOSIS — N898 Other specified noninflammatory disorders of vagina: Secondary | ICD-10-CM | POA: Diagnosis not present

## 2023-01-25 DIAGNOSIS — B3731 Acute candidiasis of vulva and vagina: Secondary | ICD-10-CM | POA: Diagnosis not present

## 2023-01-25 NOTE — Telephone Encounter (Signed)
Ok to place PPD skin test if negative screening questionnaire.

## 2023-01-25 NOTE — Telephone Encounter (Addendum)
Contacted patient regarding her nurse visit tomorrow for tb skin test. I asked if patient needed forms completed for work. She stated she didn't. She said a print out of the results would be fine. I advised her if she comes on Wednesday her test would need to be read on Friday. Patient verbalized understanding.

## 2023-01-26 ENCOUNTER — Ambulatory Visit (INDEPENDENT_AMBULATORY_CARE_PROVIDER_SITE_OTHER): Payer: Medicaid Other

## 2023-01-26 DIAGNOSIS — Z111 Encounter for screening for respiratory tuberculosis: Secondary | ICD-10-CM | POA: Diagnosis not present

## 2023-01-26 NOTE — Progress Notes (Signed)
Patient is here for TB skin test. TB skin test administered in Left forearm. Advised patient to schedule nurse visit to return on 01/28/2023 after 10:26 am. Patient verbalized understanding.  Form completed and placed up front in [place] [place]'s box for tb reading on Friday 01/28/2023.

## 2023-01-28 ENCOUNTER — Ambulatory Visit: Payer: Medicaid Other | Admitting: Nurse Practitioner

## 2023-01-28 ENCOUNTER — Telehealth: Payer: Self-pay | Admitting: Nurse Practitioner

## 2023-01-28 ENCOUNTER — Ambulatory Visit: Payer: Medicaid Other

## 2023-01-28 NOTE — Progress Notes (Signed)
PPD Reading Note PPD read and results entered in [employer]. Result: 0 mm induration. Interpretation: negative If test not read within 48-72 hours of initial placement, patient advised to repeat in other arm 1-3 weeks after this test. Allergic reaction: no  Copy of PPD Skin Test Record Form given to patient.

## 2023-01-28 NOTE — Telephone Encounter (Signed)
01/28/23 - Pt dropped off "Health Examination Certificate" form to be filled out by the provider. Form in provider's folder at F/O. She wants a call back at [phone] when form is ready for pick up.

## 2023-02-02 DIAGNOSIS — Z0279 Encounter for issue of other medical certificate: Secondary | ICD-10-CM

## 2023-02-16 NOTE — Telephone Encounter (Signed)
Pt picked up paperwork.

## 2023-02-21 NOTE — Telephone Encounter (Signed)
Error

## 2023-03-23 ENCOUNTER — Ambulatory Visit: Payer: Medicaid Other | Admitting: Nurse Practitioner

## 2023-03-23 ENCOUNTER — Encounter: Payer: Self-pay | Admitting: Nurse Practitioner

## 2023-03-23 VITALS — BP 111/79 | HR 96 | Temp 98.4°F | Resp 18 | Wt 128.2 lb

## 2023-03-23 DIAGNOSIS — N926 Irregular menstruation, unspecified: Secondary | ICD-10-CM

## 2023-03-23 DIAGNOSIS — J358 Other chronic diseases of tonsils and adenoids: Secondary | ICD-10-CM

## 2023-03-23 DIAGNOSIS — L700 Acne vulgaris: Secondary | ICD-10-CM

## 2023-03-23 LAB — POCT URINE PREGNANCY: Preg Test, Ur: NEGATIVE

## 2023-03-23 NOTE — Patient Instructions (Addendum)
Use neutrogena acne wash-salicylic acid and benzyl peroxide toner daily Hold both applications if you notice excessive dryness.  For tonsil Stone: adequate oral care and oral hydration is important Use warm salt water gaggles after each meal when you develop stones.  Go to lab

## 2023-03-23 NOTE — Assessment & Plan Note (Signed)
No acute pharyngitis or hoarseness or laryngitis.  No need for ENT referral at this time Advised about importance of adequate oral care and oral hydration. Use warm salt water gaggles after each meal when you develop stones.

## 2023-03-23 NOTE — Progress Notes (Signed)
                Established Patient Visit  Patient: Shelia Rivera   DOB: 09/07/2000   22 y.o. Female  MRN: [id number] Visit Date: 03/23/2023  Subjective:    Chief Complaint  Patient presents with   Acute Visit    PT is here for brown spots on right upper back area, itchiness is not present. Patient also think she may have tonsil stones with mild throat irritation and bad breathe smell; she is also experiencing spotting and would like blood work done.    HPI Tonsil stone No acute pharyngitis or hoarseness or laryngitis.  No need for ENT referral at this time Advised about importance of adequate oral care and oral hydration. Use warm salt water gaggles after each meal when you develop stones.  Acne vulgaris Advised to use OVER THE COUNTER Use neutrogena acne wash-salicylic acid and benzyl peroxide toner daily Hold both applications if you notice excessive dryness.  Reviewed medical, surgical, and social history today  Medications: Outpatient Medications Prior to Visit  Medication Sig   ferrous sulfate 324 (65 Fe) MG TBEC Take 1 tablet by mouth daily after breakfast. (Patient not taking: Reported on 03/23/2023)   imiquimod (ALDARA) 5 % cream Apply topically 3 (three) times a week. Till lesions resolve, no more than 16weeks, Hold if skin irritation (Patient not taking: Reported on 03/23/2023)   No facility-administered medications prior to visit.   Reviewed past medical and social history.   ROS per HPI above      Objective:  BP 111/79 (BP Location: Left Arm, Patient Position: Sitting, Cuff Size: Normal)   Pulse 96   Temp 98.4 F (36.9 C) (Temporal)   Resp 18   Wt 128 lb 3.2 oz (58.2 kg)   LMP 03/02/2023 (Exact Date)   SpO2 98%   BMI 25.46 kg/m      Physical Exam HENT:     Mouth/Throat:     Mouth: Mucous membranes are moist.     Dentition: Normal dentition. Does not have dentures. No dental tenderness, gingival swelling, dental caries, dental abscesses or gum  lesions.     Tongue: No lesions.     Palate: No mass.     Pharynx: Oropharynx is clear. Uvula midline.     Tonsils: No tonsillar exudate or tonsillar abscesses.  Skin:    Findings: Acne present.          Results for orders placed or performed in visit on 03/23/23  POCT urine pregnancy  Result Value Ref Range   Preg Test, Ur Negative Negative      Assessment & Plan:    Problem List Items Addressed This Visit     Acne vulgaris    Advised to use OVER THE COUNTER Use neutrogena acne wash-salicylic acid and benzyl peroxide toner daily Hold both applications if you notice excessive dryness.      Tonsil stone    No acute pharyngitis or hoarseness or laryngitis.  No need for ENT referral at this time Advised about importance of adequate oral care and oral hydration. Use warm salt water gaggles after each meal when you develop stones.      Other Visit Diagnoses     Irregular menstrual cycle    -  Primary   Relevant Orders   POCT urine pregnancy (Completed)      Return if symptoms worsen or fail to improve.     Alysia Penna, NP

## 2023-03-23 NOTE — Assessment & Plan Note (Signed)
Advised to use OVER THE COUNTER Use neutrogena acne wash-salicylic acid and benzyl peroxide toner daily Hold both applications if you notice excessive dryness.

## 2023-11-29 DIAGNOSIS — F419 Anxiety disorder, unspecified: Secondary | ICD-10-CM | POA: Diagnosis not present

## 2023-12-12 DIAGNOSIS — F419 Anxiety disorder, unspecified: Secondary | ICD-10-CM | POA: Diagnosis not present

## 2023-12-20 ENCOUNTER — Encounter: Payer: Self-pay | Admitting: Nurse Practitioner

## 2023-12-20 ENCOUNTER — Ambulatory Visit (INDEPENDENT_AMBULATORY_CARE_PROVIDER_SITE_OTHER): Admitting: Nurse Practitioner

## 2023-12-20 VITALS — BP 114/70 | HR 77 | Ht 59.0 in | Wt 128.4 lb

## 2023-12-20 DIAGNOSIS — Z23 Encounter for immunization: Secondary | ICD-10-CM | POA: Diagnosis not present

## 2023-12-20 DIAGNOSIS — D500 Iron deficiency anemia secondary to blood loss (chronic): Secondary | ICD-10-CM

## 2023-12-20 DIAGNOSIS — Z0000 Encounter for general adult medical examination without abnormal findings: Secondary | ICD-10-CM

## 2023-12-20 LAB — CBC
HCT: 38.1 % (ref 36.0–46.0)
Hemoglobin: 12.2 g/dL (ref 12.0–15.0)
MCHC: 31.9 g/dL (ref 30.0–36.0)
MCV: 78 fl (ref 78.0–100.0)
Platelets: 270 K/uL (ref 150.0–400.0)
RBC: 4.89 Mil/uL (ref 3.87–5.11)
RDW: 17.9 % — ABNORMAL HIGH (ref 11.5–15.5)
WBC: 4 K/uL (ref 4.0–10.5)

## 2023-12-20 LAB — COMPREHENSIVE METABOLIC PANEL WITH GFR
ALT: 8 U/L (ref 0–35)
AST: 16 U/L (ref 0–37)
Albumin: 4.8 g/dL (ref 3.5–5.2)
Alkaline Phosphatase: 42 U/L (ref 39–117)
BUN: 10 mg/dL (ref 6–23)
CO2: 27 meq/L (ref 19–32)
Calcium: 9.5 mg/dL (ref 8.4–10.5)
Chloride: 105 meq/L (ref 96–112)
Creatinine, Ser: 0.65 mg/dL (ref 0.40–1.20)
GFR: 124.37 mL/min (ref >60.00)
Glucose, Bld: 88 mg/dL (ref 70–99)
Potassium: 4.5 meq/L (ref 3.5–5.1)
Sodium: 138 meq/L (ref 135–145)
Total Bilirubin: 0.4 mg/dL (ref 0.2–1.2)
Total Protein: 7.8 g/dL (ref 6.0–8.3)

## 2023-12-20 LAB — IRON,TIBC AND FERRITIN PANEL
%SAT: 11 % — ABNORMAL LOW (ref 16–45)
Ferritin: 14 ng/mL — ABNORMAL LOW (ref 16–154)
Iron: 40 ug/dL (ref 40–190)
TIBC: 355 ug/dL (ref 250–450)

## 2023-12-20 NOTE — Progress Notes (Signed)
 Complete physical exam  Patient: Shelia Rivera   DOB: 2001/01/24   23 y.o. Female  MRN: [id number] Visit Date: 12/20/2023  Subjective:    Chief Complaint  Patient presents with   Annual Exam   Shelia Rivera is a 23 y.o. female who presents today for a complete physical exam. She reports consuming a low fat diet. Walking daily She generally feels well. She reports sleeping well. She does not have additional problems to discuss today.  Vision:Yes Dental:Yes STD Screen:No  BP Readings from Last 3 Encounters:  12/20/23 114/70  03/23/23 111/79  11/16/22 100/80   Wt Readings from Last 3 Encounters:  12/20/23 128 lb 6.4 oz (58.2 kg)  03/23/23 128 lb 3.2 oz (58.2 kg)  11/16/22 131 lb (59.4 kg)   Most recent fall risk assessment:    12/20/2023    9:35 AM  Fall Risk   Falls in the past year? 0  Injury with Fall? 0  Risk for fall due to : No Fall Risks   Depression screen:Yes - No Depression  Most recent depression screenings:    12/20/2023    9:36 AM 03/23/2023    1:28 PM  PHQ 2/9 Scores  PHQ - 2 Score 0 0  PHQ- 9 Score 0    HPI  Iron deficiency anemia due to chronic blood loss Repeat CBC and iron panel  Past Medical History:  Diagnosis Date   Anemia    Chlamydia    Past Surgical History:  Procedure Laterality Date   CESAREAN SECTION N/A 05/16/2019   Procedure: CESAREAN SECTION;  Surgeon: Kandis Devaughn Sayres, MD;  Location: [facility] OR[facility];  Service: Obstetrics;  Laterality: N/A;   FRACTURE SURGERY Right    right ankle   Social History   Socioeconomic History   Marital status: Single    Spouse name: Not on file   Number of children: Not on file   Years of education: Not on file   Highest education level: Not on file  Occupational History   Not on file  Tobacco Use   Smoking status: Never   Smokeless tobacco: Never  Vaping Use   Vaping status: Never Used  Substance and Sexual Activity   Alcohol use: Never   Drug use: Never   Sexual activity: Yes    Birth  control/protection: Condom  Other Topics Concern   Not on file  Social History Narrative   Not on file   Social Drivers of Health   Financial Resource Strain: Not on file  Food Insecurity: No Food Insecurity (07/04/2020)   Received from [facility]   Hunger Vital Sign    Within the past 12 months, you worried that your food would run out before you got the money to buy more.: Never true    Within the past 12 months, the food you bought just didn't last and you didn't have money to get more.: Never true  Transportation Needs: Not on file  Physical Activity: Not on file  Stress: Not on file  Social Connections: Unknown (10/13/2021)   Received from [facility]   Social Network    Social Network: Not on file  Intimate Partner Violence: Unknown (09/04/2021)   Received from [facility]   HITS    Physically Hurt: Not on file    Insult or Talk Down To: Not on file    Threaten Physical Harm: Not on file    Scream or Curse: Not on file   Family Status  Relation Name Status  Mother  Alive   Father  Alive   Sister  Alive   Brother  Alive   Mat Aunt  Alive   Mat Uncle  Deceased   Pat Aunt  Deceased   MGM  Deceased   MGF  Deceased   PGM  Deceased   PGF  Deceased  No partnership data on file   Family History  Problem Relation Age of Onset   Hyperlipidemia Mother    Thyroid  nodules Mother    Hypertension Father    Diabetes Maternal Aunt    Cancer Maternal Uncle 55       leukemia   Cancer Paternal Aunt        brain   Cancer Maternal Grandmother 29       ovarian   No Known Allergies  Patient Care Team: Jewelz Kobus, Roselie Rockford, NP as PCP - General (Internal Medicine)   Medications: Outpatient Medications Prior to Visit  Medication Sig   ferrous sulfate  324 (65 Fe) MG TBEC Take 1 tablet by mouth daily after breakfast.   imiquimod  (ALDARA ) 5 % cream Apply topically 3 (three) times a week. Till lesions resolve, no more than 16weeks, Hold if skin irritation (Patient not  taking: Reported on 12/20/2023)   No facility-administered medications prior to visit.    Review of Systems  Constitutional:  Negative for activity change, appetite change and unexpected weight change.  Respiratory: Negative.    Cardiovascular: Negative.   Gastrointestinal: Negative.   Endocrine: Negative for cold intolerance and heat intolerance.  Genitourinary: Negative.   Musculoskeletal: Negative.   Skin: Negative.   Neurological: Negative.   Hematological: Negative.   Psychiatric/Behavioral:  Negative for behavioral problems, decreased concentration, dysphoric mood, hallucinations, self-injury, sleep disturbance and suicidal ideas. The patient is not nervous/anxious.         Objective:  BP 114/70 (BP Location: Left Arm, Patient Position: Sitting, Cuff Size: Normal)   Pulse 77   Ht 4' 11 (1.499 m)   Wt 128 lb 6.4 oz (58.2 kg)   SpO2 99%   BMI 25.93 kg/m     Physical Exam Vitals and nursing note reviewed.  Constitutional:      General: She is not in acute distress. HENT:     Right Ear: Tympanic membrane, ear canal and external ear normal.     Left Ear: Tympanic membrane, ear canal and external ear normal.     Nose: Nose normal.  Eyes:     Extraocular Movements: Extraocular movements intact.     Conjunctiva/sclera: Conjunctivae normal.     Pupils: Pupils are equal, round, and reactive to light.  Neck:     Thyroid : No thyroid  mass, thyromegaly or thyroid  tenderness.  Cardiovascular:     Rate and Rhythm: Normal rate and regular rhythm.     Pulses: Normal pulses.     Heart sounds: Normal heart sounds.  Pulmonary:     Effort: Pulmonary effort is normal.     Breath sounds: Normal breath sounds.  Abdominal:     General: Bowel sounds are normal.     Palpations: Abdomen is soft.  Musculoskeletal:        General: Normal range of motion.     Cervical back: Normal range of motion and neck supple.     Right lower leg: No edema.     Left lower leg: No edema.   Lymphadenopathy:     Cervical: No cervical adenopathy.  Skin:    General: Skin is warm and dry.  Neurological:  Mental Status: She is alert and oriented to person, place, and time.     Cranial Nerves: No cranial nerve deficit.  Psychiatric:        Mood and Affect: Mood normal.        Behavior: Behavior normal.        Thought Content: Thought content normal.      No results found for any visits on 12/20/23.    Assessment & Plan:    Routine Health Maintenance and Physical Exam  Immunization History  Administered Date(s) Administered   DTaP 01/13/2001, 03/17/2001, 05/16/2001, 11/30/2002   Dtap, Unspecified 09/23/2005   HIB, Unspecified 01/13/2001, 03/17/2001, 05/16/2001, 11/30/2002   HPV 9-valent 01/14/2015, 12/20/2023   HPV Quadrivalent 12/13/2013   Hep A, Unspecified 09/23/2005   Hep B, Unspecified July 01, 2000, 01/13/2001, 05/16/2001   Hepatitis A, Ped/Adol-2 Dose 01/10/2009   IPV 01/13/2001, 03/17/2001, 08/08/2001   Influenza Nasal 03/28/2010   Influenza, Seasonal, Injecte, Preservative Fre 07/08/2007, 08/05/2007   Influenza,inj,Quad PF,6+ Mos 04/24/2019   Influenza-Unspecified 03/20/2005   MMR 11/22/2001, 09/23/2005   Meningococcal B, OMV 02/16/2017   Meningococcal Conjugate 11/24/2011, 02/16/2017   PFIZER(Purple Top)SARS-COV-2 Vaccination 07/31/2019, 08/10/2019, 08/31/2019, 03/17/2020   PPD Test 01/26/2023   Pfizer Covid-19 Vaccine Bivalent Booster 52yrs & up 03/17/2020   Pneumococcal Conjugate PCV 7 01/13/2001, 03/17/2001, 08/08/2001, 11/22/2001   Polio, Unspecified 09/23/2005   Tdap 11/24/2011, 02/28/2019   Varicella 11/22/2001, 09/23/2005    Health Maintenance  Topic Date Due   COVID-19 Vaccine (6 - 2024-25 season) 01/05/2024 (Originally 01/30/2023)   CHLAMYDIA SCREENING  12/19/2024 (Originally 05/15/2020)   Hepatitis B Vaccines (1 of 3 - 19+ 3-dose series) 12/19/2024 (Originally 11/08/2019)   Hepatitis C Screening  12/19/2024 (Originally 11/08/2018)    Meningococcal B Vaccine (2 of 2 - Bexsero SCDM 2-dose series) 12/19/2024 (Originally 08/16/2017)   INFLUENZA VACCINE  12/30/2023   Cervical Cancer Screening (Pap smear)  11/15/2025   HPV VACCINES  Completed   HIV Screening  Completed   DTaP/Tdap/Td  Discontinued    Discussed health benefits of physical activity, and encouraged her to engage in regular exercise appropriate for her age and condition.  Problem List Items Addressed This Visit     Iron deficiency anemia due to chronic blood loss   Repeat CBC and iron panel      Relevant Orders   CBC   Iron, TIBC and Ferritin Panel   Other Visit Diagnoses       Preventative health care    -  Primary   Relevant Orders   Comprehensive metabolic panel with GFR     Immunization due       Relevant Orders   HPV 9-valent vaccine,Recombinat (Completed)      Return in about 1 year (around 12/19/2024) for CPE (fasting).     Roselie Mood, NP

## 2023-12-20 NOTE — Assessment & Plan Note (Signed)
Repeat CBC and iron panel

## 2023-12-20 NOTE — Patient Instructions (Signed)
 Go to lab

## 2023-12-22 ENCOUNTER — Ambulatory Visit: Payer: Self-pay | Admitting: Nurse Practitioner

## 2023-12-22 DIAGNOSIS — D500 Iron deficiency anemia secondary to blood loss (chronic): Secondary | ICD-10-CM

## 2023-12-28 DIAGNOSIS — F419 Anxiety disorder, unspecified: Secondary | ICD-10-CM | POA: Diagnosis not present

## 2024-01-12 DIAGNOSIS — F419 Anxiety disorder, unspecified: Secondary | ICD-10-CM | POA: Diagnosis not present

## 2024-01-23 ENCOUNTER — Encounter: Admitting: Nurse Practitioner

## 2024-03-14 DIAGNOSIS — F419 Anxiety disorder, unspecified: Secondary | ICD-10-CM | POA: Diagnosis not present
